# Patient Record
Sex: Female | Born: 1935 | Race: White | Hispanic: No | Smoking: Former smoker
Health system: Southern US, Community
[De-identification: ages and names within clinical notes are randomized; demographics above are authoritative.]

## PROBLEM LIST (undated history)

## (undated) DIAGNOSIS — E785 Hyperlipidemia, unspecified: Secondary | ICD-10-CM

## (undated) DIAGNOSIS — E119 Type 2 diabetes mellitus without complications: Secondary | ICD-10-CM

## (undated) DIAGNOSIS — I495 Sick sinus syndrome: Secondary | ICD-10-CM

## (undated) DIAGNOSIS — Z951 Presence of aortocoronary bypass graft: Secondary | ICD-10-CM

## (undated) DIAGNOSIS — I2510 Atherosclerotic heart disease of native coronary artery without angina pectoris: Secondary | ICD-10-CM

## (undated) DIAGNOSIS — H409 Unspecified glaucoma: Secondary | ICD-10-CM

## (undated) DIAGNOSIS — R079 Chest pain, unspecified: Secondary | ICD-10-CM

## (undated) DIAGNOSIS — I6529 Occlusion and stenosis of unspecified carotid artery: Secondary | ICD-10-CM

## (undated) DIAGNOSIS — I719 Aortic aneurysm of unspecified site, without rupture: Secondary | ICD-10-CM

## (undated) DIAGNOSIS — Z950 Presence of cardiac pacemaker: Secondary | ICD-10-CM

## (undated) DIAGNOSIS — K5730 Diverticulosis of large intestine without perforation or abscess without bleeding: Secondary | ICD-10-CM

## (undated) DIAGNOSIS — I4891 Unspecified atrial fibrillation: Secondary | ICD-10-CM

## (undated) DIAGNOSIS — Z9889 Other specified postprocedural states: Secondary | ICD-10-CM

## (undated) HISTORY — DX: Chest pain, unspecified: R07.9

## (undated) HISTORY — DX: Presence of aortocoronary bypass graft: Z95.1

## (undated) HISTORY — DX: Other specified postprocedural states: Z98.890

## (undated) HISTORY — PX: APPENDECTOMY: SHX54

## (undated) HISTORY — DX: Unspecified atrial fibrillation: I48.91

## (undated) HISTORY — DX: Sick sinus syndrome: I49.5

## (undated) HISTORY — DX: Type 2 diabetes mellitus without complications: E11.9

## (undated) HISTORY — PX: BACK SURGERY: SHX140

## (undated) HISTORY — DX: Hyperlipidemia, unspecified: E78.5

## (undated) HISTORY — DX: Presence of cardiac pacemaker: Z95.0

## (undated) HISTORY — DX: Diverticulosis of large intestine without perforation or abscess without bleeding: K57.30

## (undated) HISTORY — DX: Occlusion and stenosis of unspecified carotid artery: I65.29

## (undated) HISTORY — PX: PACEMAKER INSERTION: SHX728

## (undated) HISTORY — DX: Unspecified glaucoma: H40.9

## (undated) HISTORY — PX: TONSILLECTOMY AND ADENOIDECTOMY: SUR1326

## (undated) HISTORY — PX: CORONARY ARTERY BYPASS GRAFT: SHX141

## (undated) HISTORY — DX: Aortic aneurysm of unspecified site, without rupture: I71.9

## (undated) HISTORY — DX: Atherosclerotic heart disease of native coronary artery without angina pectoris: I25.10

---

## 2000-02-12 ENCOUNTER — Ambulatory Visit (HOSPITAL_COMMUNITY): Admission: RE | Admit: 2000-02-12 | Discharge: 2000-02-12 | Payer: Self-pay | Admitting: Ophthalmology

## 2000-03-16 ENCOUNTER — Encounter: Payer: Self-pay | Admitting: Internal Medicine

## 2000-03-16 ENCOUNTER — Ambulatory Visit (HOSPITAL_COMMUNITY): Admission: RE | Admit: 2000-03-16 | Discharge: 2000-03-16 | Payer: Self-pay | Admitting: Internal Medicine

## 2004-05-03 ENCOUNTER — Emergency Department (HOSPITAL_COMMUNITY): Admission: EM | Admit: 2004-05-03 | Discharge: 2004-05-03 | Payer: Self-pay | Admitting: Emergency Medicine

## 2005-06-16 DIAGNOSIS — Z950 Presence of cardiac pacemaker: Secondary | ICD-10-CM

## 2005-06-16 DIAGNOSIS — Z9889 Other specified postprocedural states: Secondary | ICD-10-CM

## 2005-06-16 DIAGNOSIS — Z951 Presence of aortocoronary bypass graft: Secondary | ICD-10-CM

## 2005-06-16 HISTORY — DX: Presence of cardiac pacemaker: Z95.0

## 2005-06-16 HISTORY — DX: Presence of aortocoronary bypass graft: Z95.1

## 2005-06-16 HISTORY — DX: Other specified postprocedural states: Z98.890

## 2005-10-19 ENCOUNTER — Inpatient Hospital Stay (HOSPITAL_COMMUNITY): Admission: EM | Admit: 2005-10-19 | Discharge: 2005-10-20 | Payer: Self-pay | Admitting: Emergency Medicine

## 2006-02-03 ENCOUNTER — Ambulatory Visit (HOSPITAL_COMMUNITY): Admission: RE | Admit: 2006-02-03 | Discharge: 2006-02-03 | Payer: Self-pay | Admitting: Internal Medicine

## 2006-02-03 ENCOUNTER — Ambulatory Visit: Payer: Self-pay | Admitting: Internal Medicine

## 2006-02-04 ENCOUNTER — Ambulatory Visit: Payer: Self-pay | Admitting: Internal Medicine

## 2006-02-10 ENCOUNTER — Ambulatory Visit (HOSPITAL_COMMUNITY): Admission: RE | Admit: 2006-02-10 | Discharge: 2006-02-10 | Payer: Self-pay | Admitting: Neurosurgery

## 2006-04-27 ENCOUNTER — Ambulatory Visit (HOSPITAL_COMMUNITY): Admission: RE | Admit: 2006-04-27 | Discharge: 2006-04-27 | Payer: Self-pay | Admitting: Cardiology

## 2006-04-27 ENCOUNTER — Encounter: Payer: Self-pay | Admitting: Vascular Surgery

## 2006-05-18 ENCOUNTER — Inpatient Hospital Stay (HOSPITAL_COMMUNITY): Admission: RE | Admit: 2006-05-18 | Discharge: 2006-05-27 | Payer: Self-pay | Admitting: Surgery

## 2006-06-25 ENCOUNTER — Encounter (HOSPITAL_COMMUNITY): Admission: RE | Admit: 2006-06-25 | Discharge: 2006-06-26 | Payer: Self-pay | Admitting: Cardiology

## 2006-06-25 ENCOUNTER — Encounter (HOSPITAL_COMMUNITY): Admission: RE | Admit: 2006-06-25 | Discharge: 2006-09-23 | Payer: Self-pay | Admitting: Cardiology

## 2006-12-15 ENCOUNTER — Encounter: Admission: RE | Admit: 2006-12-15 | Discharge: 2006-12-15 | Payer: Self-pay | Admitting: Internal Medicine

## 2007-08-23 IMAGING — CR DG CHEST 2V
2 series · 2 of 2 positions shown · non-contrast
Comparison: None.

CLINICAL DATA: 86 year old; shortness of breath and chest pain.  Preoperative respiratory exam for bypass surgery.
 CHEST - 2 VIEW - 05/14/06:

[view not recorded (1 of 2)]
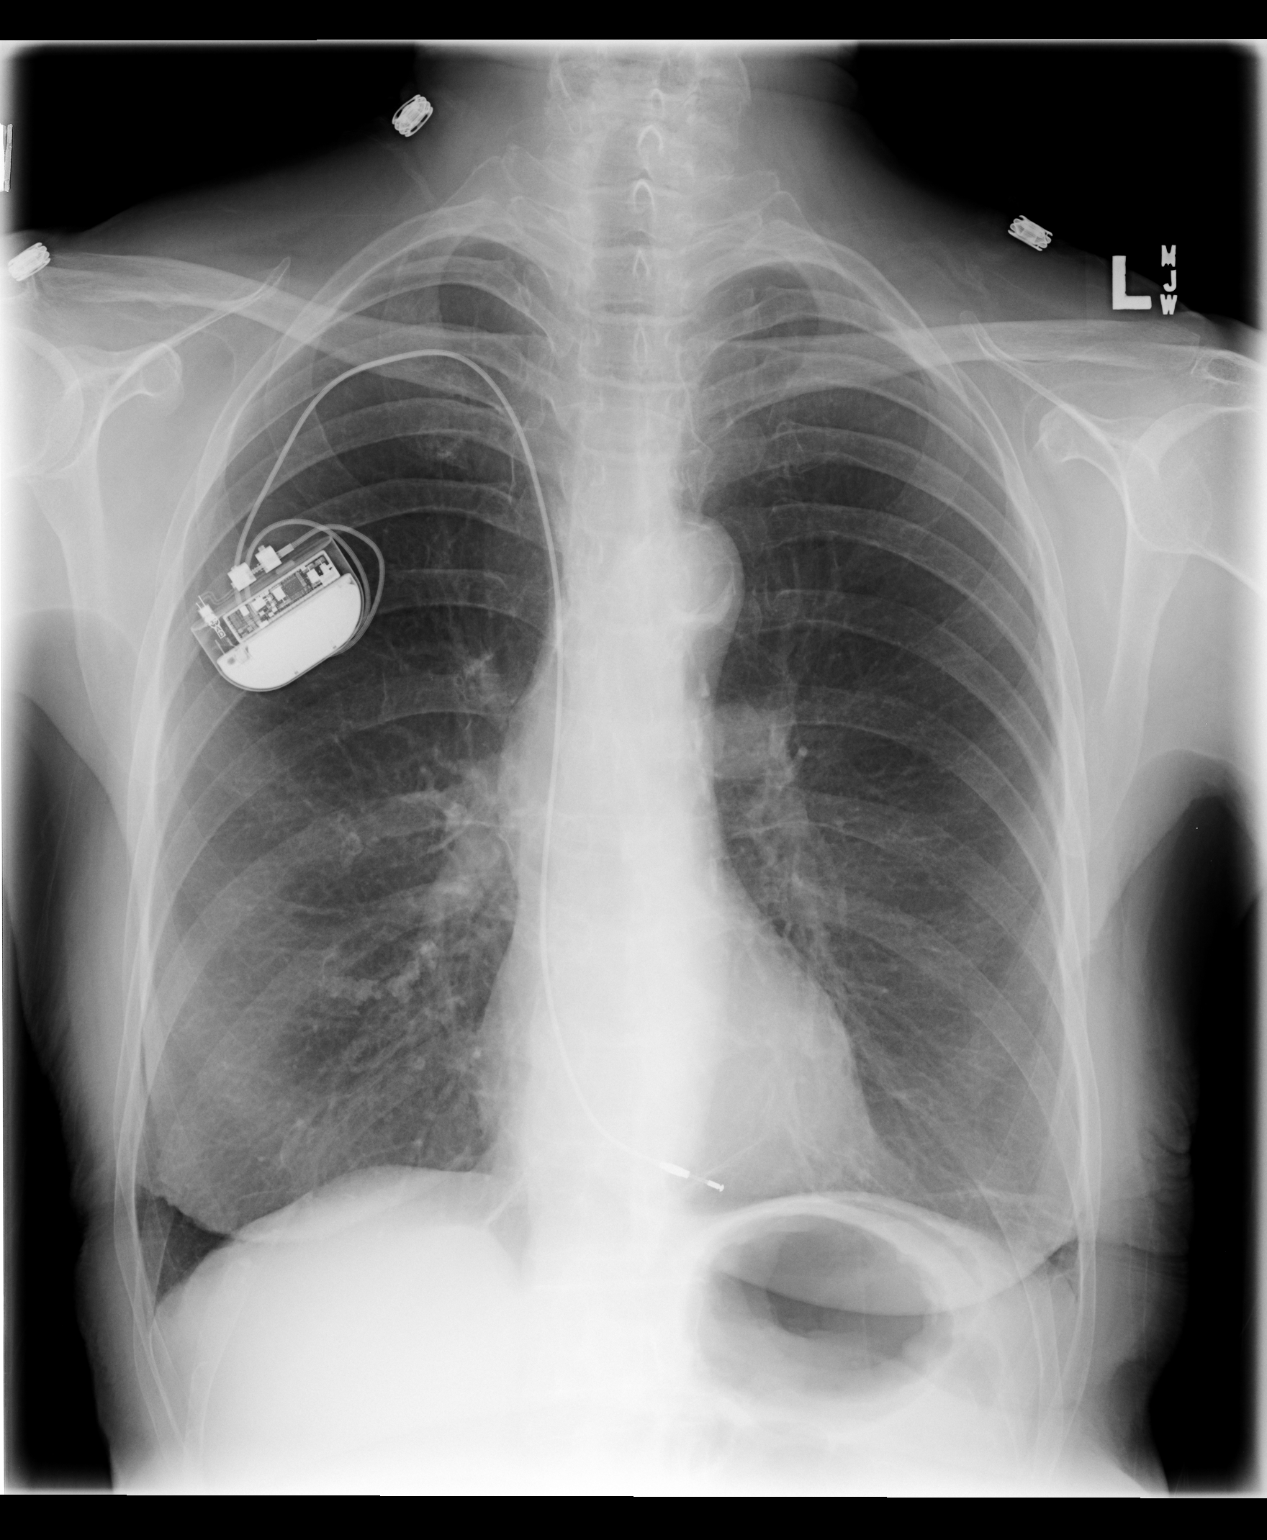

[view not recorded (2 of 2)]
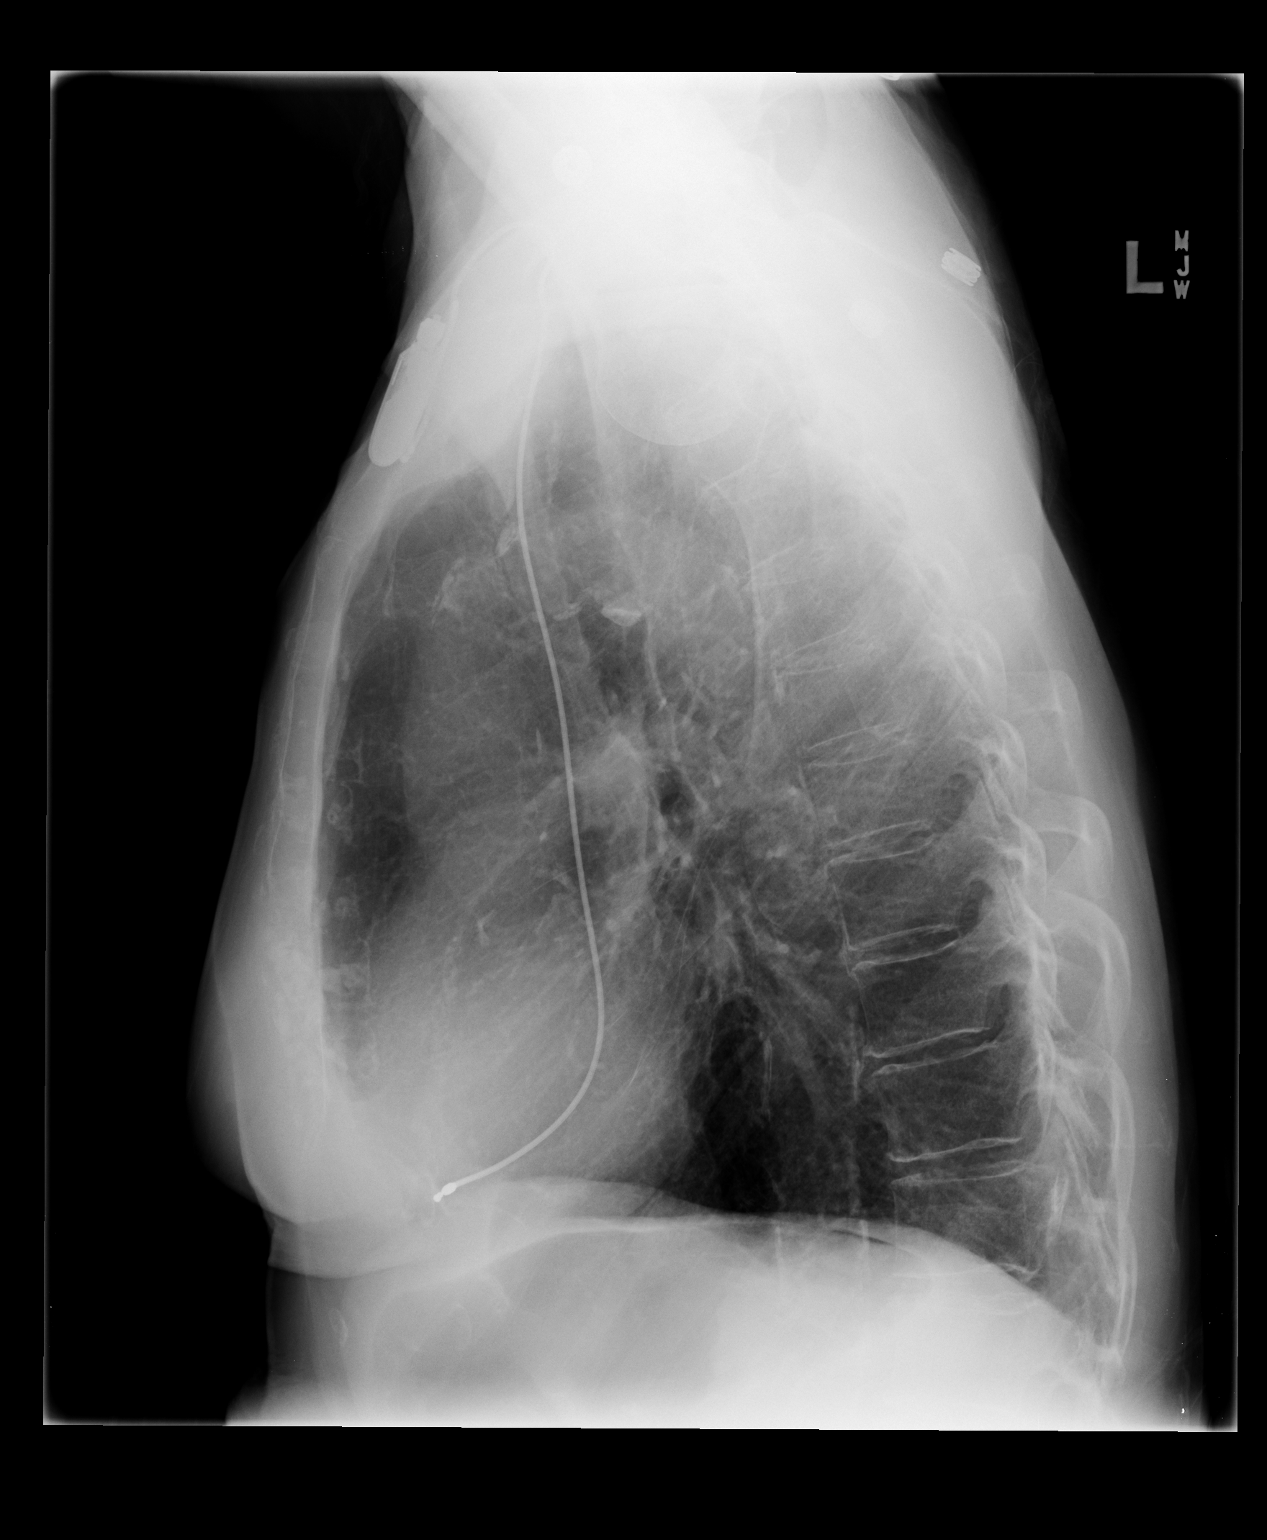

[2 of 2 positions shown; findings below may reference images not displayed]

FINDINGS: The cardiac silhouette, mediastinal and hilar contours are within normal limits.   There is a single pacer wire with its tip in the right ventricle.  The lungs demonstrate mild hyperinflation but no acute pulmonary findings.  There are streaky basilar scarring changes.  Vascular calcifications are noted.  The bony structures are intact.
IMPRESSION: No acute cardiopulmonary findings.

## 2007-08-30 IMAGING — CR DG CHEST 1V PORT
1 series · 1 of 1 positions shown · non-contrast
Comparison: 05/20/06.

CLINICAL DATA: CABG. 
 PORTABLE CHEST ? 1 VIEW ? 05/21/06 AT 6989 HOURS:

[view not recorded]
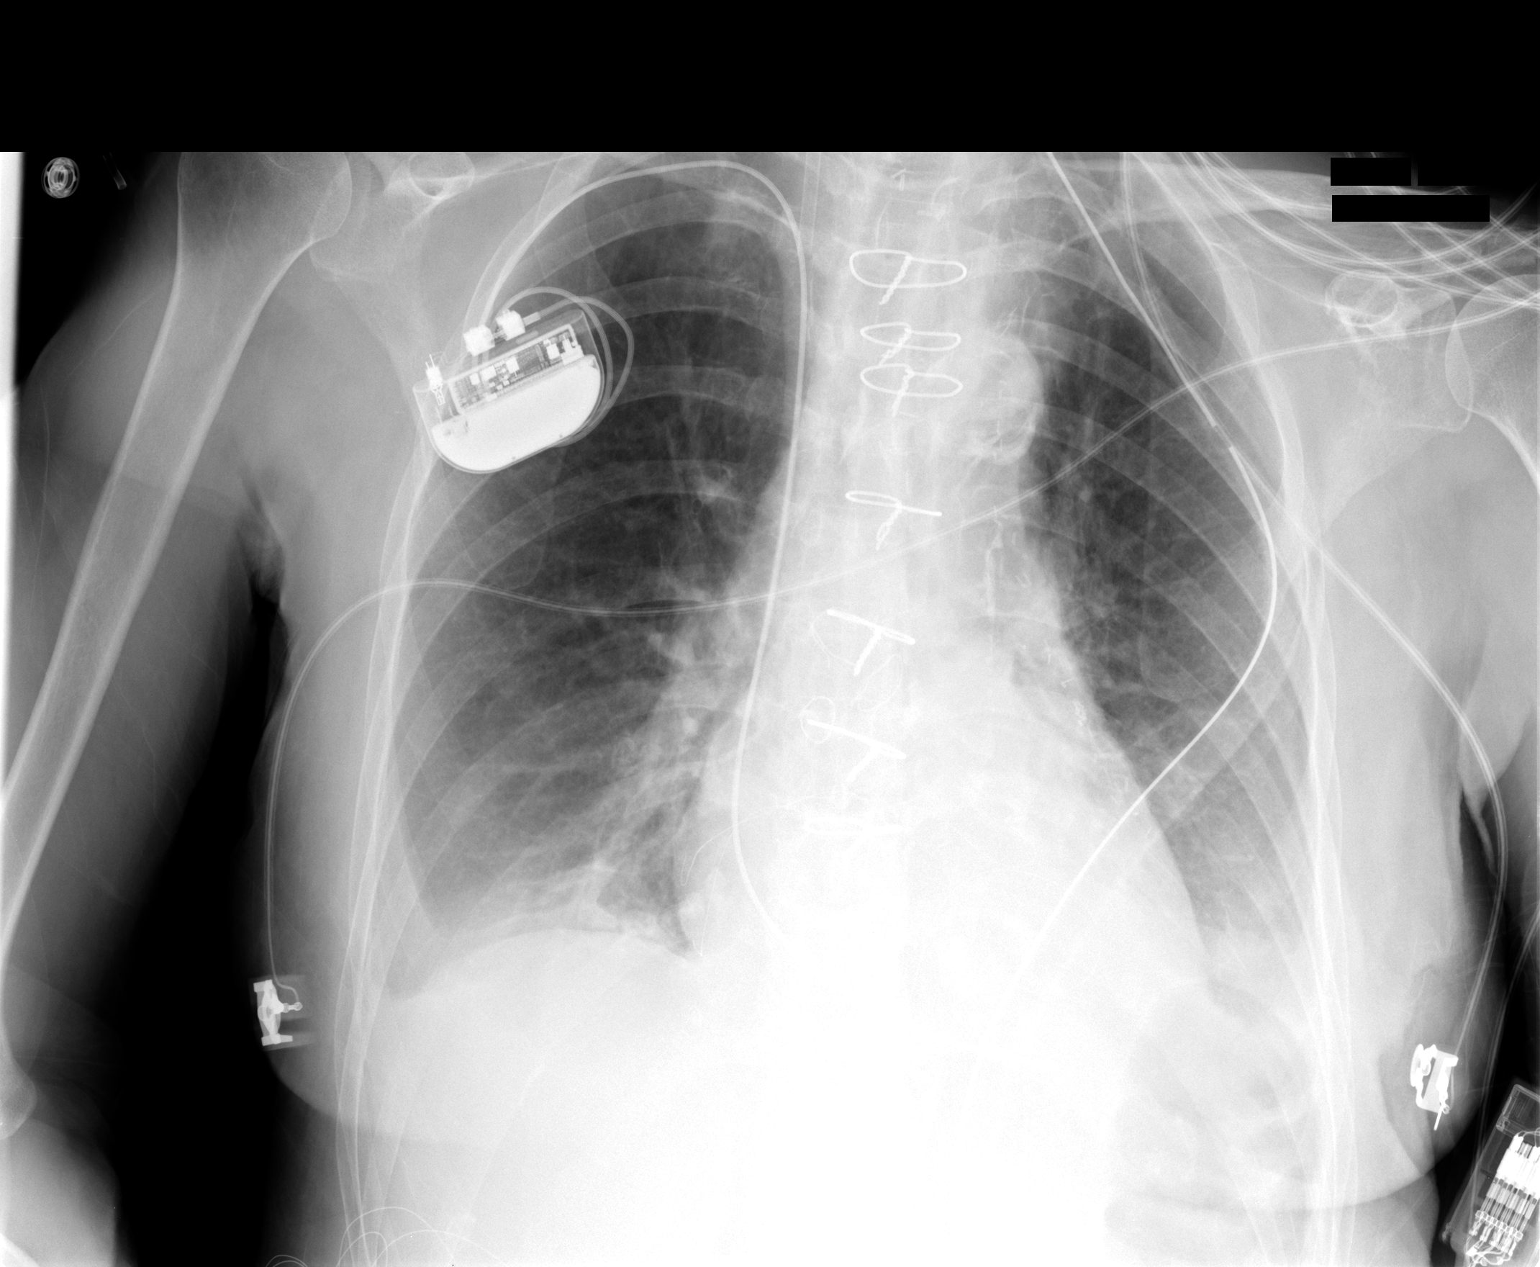

[1 of 1 positions shown; findings below may reference images not displayed]

FINDINGS: No definite pneumothorax is seen.  Left chest tube remains.  Basilar opacities remain, consistent with atelectasis and effusion.
IMPRESSION: No definite pneumothorax.  Little change in basilar atelectasis and effusions.

## 2007-08-31 IMAGING — CR DG CHEST 2V
2 series · 2 of 2 positions shown · non-contrast
Comparison: 05/21/06.

CLINICAL DATA: Coronary artery disease.  CABG.
 CHEST - 2 VIEW:

[w chest pa]
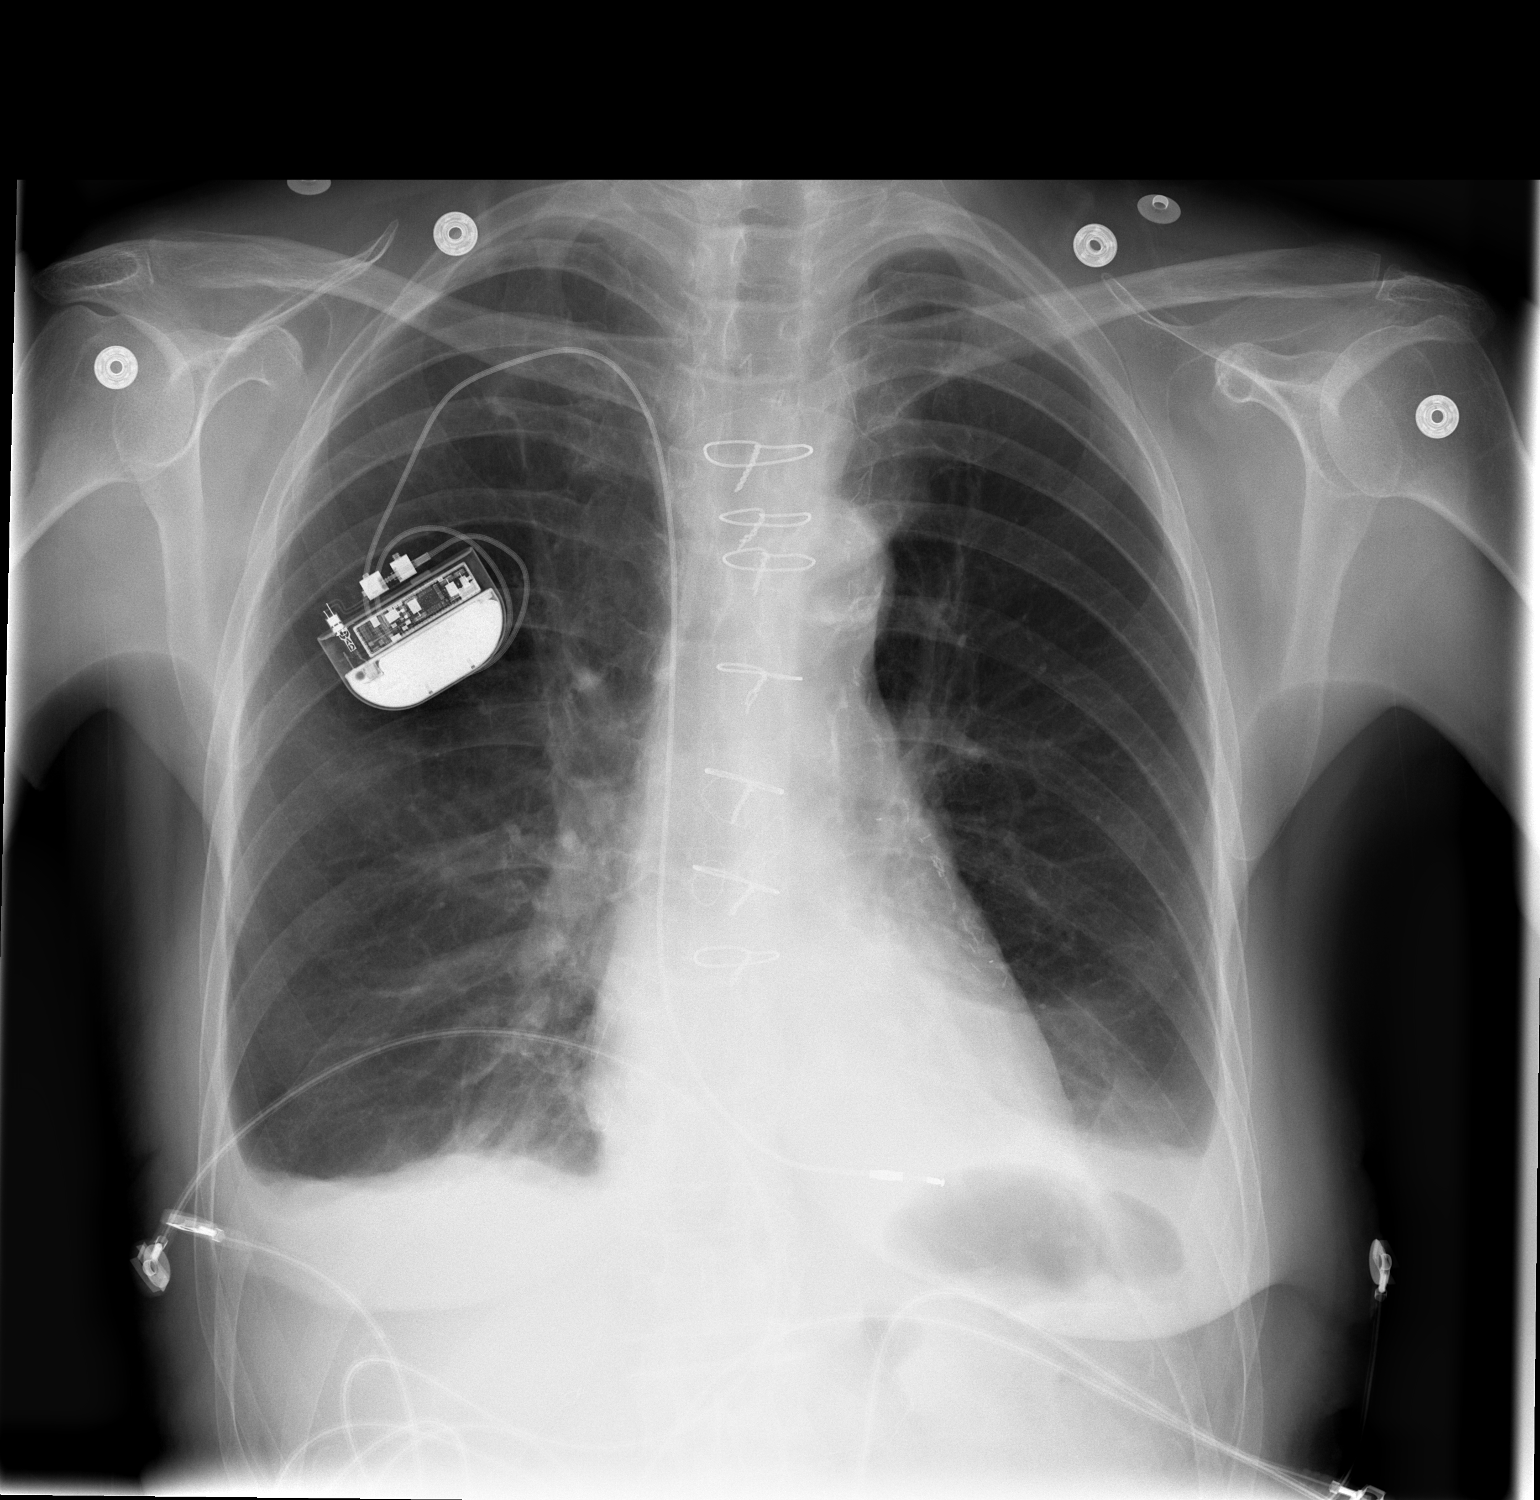

[w chest lat]
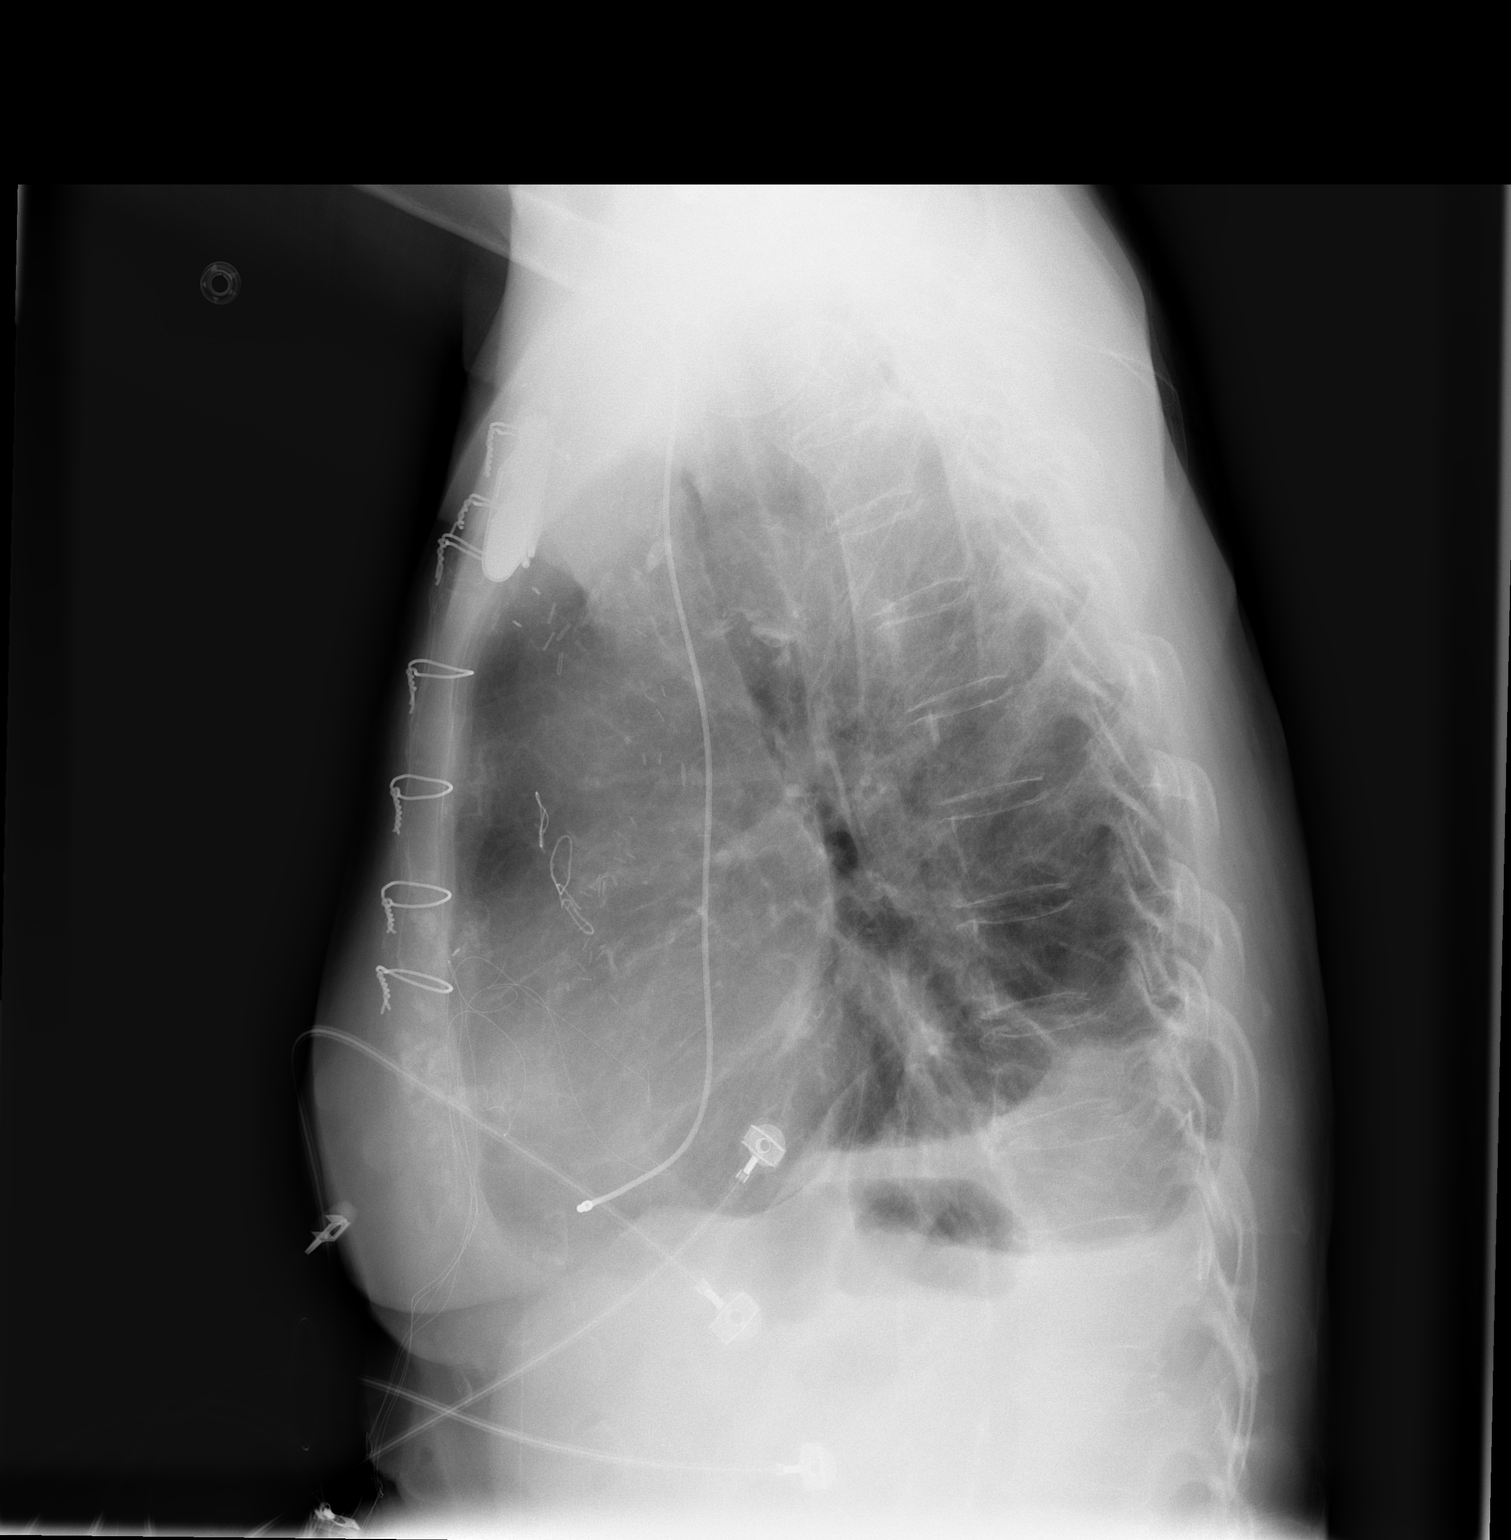

[2 of 2 positions shown; findings below may reference images not displayed]

FINDINGS: Stable cardiomediastinal silhouette.  Status post removal of left pleural chest tube and SGC sheath.  No pneumothorax.  Slight improvement in aeration, left lower lobe.   Partial left lower lobar atelectasis.  Small pleural effusions.  Improved aeration at the bases.  Resolved mild vascular congestion.
IMPRESSION: No pneumothorax.  Improved aeration of the lungs.  Mild atelectatic changes, left lower lobe. Small pleural effusions.

## 2008-11-05 ENCOUNTER — Emergency Department (HOSPITAL_COMMUNITY): Admission: EM | Admit: 2008-11-05 | Discharge: 2008-11-05 | Payer: Self-pay | Admitting: Emergency Medicine

## 2009-06-22 ENCOUNTER — Ambulatory Visit: Payer: Self-pay | Admitting: Vascular Surgery

## 2009-09-25 ENCOUNTER — Inpatient Hospital Stay (HOSPITAL_COMMUNITY): Admission: EM | Admit: 2009-09-25 | Discharge: 2009-09-26 | Payer: Self-pay | Admitting: Emergency Medicine

## 2009-09-25 DIAGNOSIS — R079 Chest pain, unspecified: Secondary | ICD-10-CM

## 2009-09-25 HISTORY — DX: Chest pain, unspecified: R07.9

## 2010-07-07 ENCOUNTER — Encounter: Payer: Self-pay | Admitting: Internal Medicine

## 2010-09-04 LAB — POCT I-STAT, CHEM 8
BUN: 15 mg/dL (ref 6–23)
Calcium, Ion: 1.07 mmol/L — ABNORMAL LOW (ref 1.12–1.32)
Chloride: 104 mEq/L (ref 96–112)
Creatinine, Ser: 0.9 mg/dL (ref 0.4–1.2)
HCT: 39 % (ref 36.0–46.0)
Sodium: 136 mEq/L (ref 135–145)

## 2010-09-04 LAB — POCT CARDIAC MARKERS
Myoglobin, poc: 91.9 ng/mL (ref 12–200)
Troponin i, poc: <0.05 ng/mL (ref 0.00–0.09)

## 2010-09-04 LAB — BASIC METABOLIC PANEL
BUN: 14 mg/dL (ref 6–23)
CO2: 21 mEq/L (ref 19–32)
Chloride: 105 mEq/L (ref 96–112)
GFR calc Af Amer: >60 mL/min (ref >60)
Glucose, Bld: 123 mg/dL — ABNORMAL HIGH (ref 70–99)
Sodium: 135 mEq/L (ref 135–145)

## 2010-09-04 LAB — CBC
HCT: 40.1 % (ref 36.0–46.0)
Hemoglobin: 13.6 g/dL (ref 12.0–15.0)
Platelets: 183 10*3/uL (ref 150–400)
RBC: 4.42 MIL/uL (ref 3.87–5.11)
RDW: 13.4 % (ref 11.5–15.5)
WBC: 5.4 10*3/uL (ref 4.0–10.5)

## 2010-09-04 LAB — CK TOTAL AND CKMB (NOT AT ARMC)
Relative Index: INVALID (ref 0.0–2.5)
Total CK: 71 U/L (ref 7–177)

## 2010-09-04 LAB — PROTIME-INR: INR: 0.87 (ref 0.00–1.49)

## 2010-09-04 LAB — LIPID PANEL: VLDL: 23 mg/dL (ref 0–40)

## 2010-09-04 LAB — CARDIAC PANEL(CRET KIN+CKTOT+MB+TROPI): Relative Index: INVALID (ref 0.0–2.5)

## 2010-09-24 LAB — URINALYSIS, ROUTINE W REFLEX MICROSCOPIC
Glucose, UA: NEGATIVE mg/dL
Ketones, ur: NEGATIVE mg/dL
Nitrite: NEGATIVE
Specific Gravity, Urine: 1.007 (ref 1.005–1.030)
pH: 7 (ref 5.0–8.0)

## 2010-09-24 LAB — URINE MICROSCOPIC-ADD ON

## 2010-10-29 NOTE — Procedures (Signed)
DUPLEX ULTRASOUND OF ABDOMINAL AORTA   INDICATION:  Followup abdominal aortic aneurysm.   HISTORY:  Diabetes:  No.  Cardiac:  No.  Hypertension:  Yes.  Smoking:  No.  Connective Tissue Disorder:  Family History:  No.  Previous Surgery:  No.   DUPLEX EXAM:         AP (cm)                   TRANSVERSE (cm)  Proximal             1.63 cm                   1.54 cm  Mid                  2.63 cm                   3.66 cm  Distal               1.96 cm                   1.96 cm  Right Iliac          1.06 cm                   0.86 cm  Left Iliac           0.74 cm                   0.94 cm   PREVIOUS:  Date:  AP:  TRANSVERSE:   IMPRESSION:  There appears to be an abdominal aortic aneurysm noted with  largest measurement of 2.63 cm x 3.66 cm.   ___________________________________________  Larina Earthly, M.D.   CB/MEDQ  D:  06/22/2009  T:  06/22/2009  Job:  [id number]

## 2010-10-29 NOTE — Consult Note (Signed)
NEW PATIENT CONSULTATION   Lisa Marquez, Lisa Marquez  DOB:  April 16, 1920                                       06/22/2009  ZOXWR#:[id number]   Lisa Marquez presents today for evaluation of abdominal aortic aneurysm.  She also has known carotid stenosis.  We have discussed this with her as  well.  She is a very pleasant, alert, white female who does have a  history of coronary artery bypass grafting approximately 3 years ago.  She has had a known history of a small aneurysm and has been followed  since that time.  She also has a history of known asymptomatic left  carotid stenosis which is being followed at [facility] with serial ultrasounds.  She specifically denies any  amaurosis fugax, transient ischemic attack or stroke, and has no  symptoms referable to her aneurysm, and no history of performed emboli.   PAST HISTORY:  Significant for coronary disease as mentioned with  coronary bypass grafting, hypothyroidism.  She is not diabetic.   SOCIAL HISTORY:  She is retired, widowed.   FAMILY HISTORY:  Mother died of respiratory illness at age 32.  Father  of stroke at age 75.  Sister had lung cancer.  She has 2 sons who are  alive and well.   REVIEW OF SYSTEMS:  Negative except for constipation and leg pain with  walking.   PHYSICAL EXAM:  Well-developed, well-nourished white female appearing  stated age, 51.  She is in no acute distress.  Blood pressure 152/79, pulse 92, respirations 14.  HEENT:  Atraumatic, normocephalic.  I could not appreciate a bruit on  her neck.  She has no lymphadenopathy in her neck or other masses.  CHEST:  Clear bilaterally.  HEART:  Regular rate and rhythm.  ABDOMEN:  Soft.  I do not palpate an aneurysm.  She has no tenderness  and no other masses.  MUSCULOSKELETAL:  No major deformities or cyanosis.  NEUROLOGICAL:  Intact with no weakness.  SKIN:  Without rashes.   On ultrasound today, maximal diameter is 3.5 cm.  Her  duplex by Dr.  Carolee Rota office from April 05, 2009, shows moderate to severe left  carotid stenosis . I discussed this at length with Ms. Denes and son  present.  I would not recommend any further ultrasound followup due to  her advanced age and very small aneurysm.  I feel that it would be quite  unlikely that she would have ever progress to a size that would warrant  repair at her advanced age, 77.  I would recommend continued yearly carotid  duplex ultrasound, as is being done in Dr. Carolee Rota office and would  recommend further evaluation for either focal symptoms or progressive  stenosis.  She will see [place] again on an as-needed basis.  Larina Earthly, M.D.  Electronically Signed   TFE/MEDQ  D:  06/22/2009  T:  06/25/2009  Job:  [id number]   cc:   Soyla Murphy. Renne Crigler, M.D.

## 2010-11-01 NOTE — Assessment & Plan Note (Signed)
[facility]                           GASTROENTEROLOGY OFFICE NOTE   NAME:Mccombs, Samyiah                         MRN:          [id number]  DATE:02/03/2006                            DOB:          1919/11/08    Ms. Greening is a delightful, 77 year old white female, here today as an  acute working for abdominal distention, abdominal discomfort, change in the  bowel movements to ropy, narrow caliber stools.  We have seen Ms. Oh  for constipation several years ago.  Unfortunately, we cannot locate her  records.  She had a colonoscopy about six years ago, but I was unable to  retrieve it from the [place] system.  She has been on MiraLax 17 g daily with  good results for the past several years.  She denies rectal bleeding.  She  denies any nausea or vomiting, but has had early satiety.  She denies  heartburn, dysphagia.  She has to strain to have a bowel movement.   MEDICATIONS:  1. Propranolol 10 mg twice daily.  2. Micardis 40 mg twice daily.  3. MiraLax 17 g daily.  4. Benefiber.  5. Aspirin.   PAST HISTORY:  High blood pressure, heart rhythm problems, appendectomy, and  back surgery.   FAMILY HISTORY:  Mother had respiratory problems.  Father had a stroke.  Lung cancer in sister.  Heart disease in brother.   SOCIAL HISTORY:  She is widowed, has two children.  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  She does not smoke and does not  drink.   REVIEW OF SYSTEMS:  Weight has been stable.  She has fainting spells, back  pain, chronic constipation.   PHYSICAL EXAMINATION:  VITAL SIGNS:  Blood pressure 136/82, pulse 52, weight  123 pounds.  GENERAL:  She was in no distress, but somewhat agitated, talking about her  ropy bowel movements, over and over.  HEENT:  Oral cavity was normal.  Sclerae is anicteric.  NECK:  Supple, without adenopathy.  LUNGS:  Clear to auscultation.  COR:  With rapid S1, S2, no murmur.  ABDOMEN:   Soft, mildly protuberant with increased tympany throughout the  entire colon, but soft, able to manipulate the air through the colon.  There  was no mass, no bruit.  RECTAL EXAM:  With normal rectal tone.  There was scant yellow stool, which  was heme-negative.  EXTREMITIES:  No edema.   IMPRESSION:  1. The patient is an 77 year old white female with change in the bowel      habits, chronic constipation, now with new-onset abdominal distention.      It is possibly partial colon obstruction, rule out symptomatic      diverticulosis, irritable bowel syndrome, so possibly just a rectocele,      causing incomplete evacuation.  2. Early satiety.  This again could be related to constipation and      abdominal distention.  There is no evidence of occult GI blood loss and      she really does not have any specific upper GI symptoms of  reflux.      Rule out pancreatic disease.  3. Status post previous colonoscopy, six or seven years ago.  We need to      obtain the report of it.  4. Rule out bacterial overgrowth, causing abdominal distention, dyspepsia.   PLAN:  1. Discontinue MiraLax.  Start Amitiza 25 micrograms daily.  2. Schedule upper abdominal ultrasound to specifically look at the      ultrasound of her gallbladder.  3. Today, we will obtain KUB to assess the abdominal distention, rule out      distended colon versus small bowel.  4. Obtain colonoscopy report from Dr. Smitty Cords.  5. CBC, amylase, lipase and CMET today.                                   Hedwig Morton. Juanda Chance, MD   DMB/MedQ  DD:  02/03/2006  DT:  02/04/2006  Job #:  [id number]   cc:   Othelia Pulling, MD

## 2010-11-01 NOTE — Procedures (Signed)
[facility]                            ABDOMINAL ULTRASOUND REPORT   NAME:Lisa Marquez, Lisa Marquez                         MRN:          [id number]  DATE:02/09/2006                            DOB:          [birth year]    ACCESSION NUMBER:  [id number]   INDICATIONS:  Abdominal pain.   READING PHYSICIAN:  Hedwig Morton. Juanda Chance, MD   PROCEDURE:  Multiplanar abdominal ultrasound imaging was performed in the  upright, supine, right and left lateral decubitus positions.   RESULTS:  Abdominal aorta reveals fusiform aneurysm, 3.4 cm in largest  diameter in mid portion of the aorta.  Inferior vena cava is patent.   The pancreas appears normal throughout the head, body and tail without  evidence of ductal dilatation, pancreatic masses, or peripancreatic  inflammation.   Gallbladder with multiple mobile stone measuring 5-8 mm in diameter.  Gallbladder wall is normal at 2.2 mm.  No pericholecystic fluid.   The common bile duct measures 6.9 mm, within normal limits.   The liver appears normal without evidence of parenchymal lesion, ductal  dilatation or vascular abnormality.   Kidneys, right 9.1 cm, left 9.7 cm.   Spleen is 9 cm.   IMPRESSION:  1. Cholelithiasis, multiple mobile stones with normal appearing      gallbladder wall and common bile duct.  2. Small fusiform abdominal aortic aneurysm at 3.4 cm at largest diameter.                                   Hedwig Morton. Juanda Chance, MD   DMB/MedQ  DD:  02/09/2006  DT:  02/09/2006  Job #:  [id number]   cc:   Othelia Pulling, MD

## 2010-11-01 NOTE — H&P (Signed)
NAME:  Lisa Marquez, Lisa Marquez NO.:  [id number]   MEDICAL RECORD NO.:  [id number]          PATIENT TYPE:  INP   LOCATION:  1824                         FACILITY:  [facility]   PHYSICIAN:  Lisa Marquez, M.D.DATE OF BIRTH:  1920/03/04   DATE OF ADMISSION:  10/19/2005  DATE OF DISCHARGE:                                HISTORY & PHYSICAL   PRIMARY CARE PHYSICIAN:  Lisa Marquez, M.D.   CHIEF COMPLAINT:  Syncope.   HISTORY OF PRESENT ILLNESS:  Ms. Lisa Marquez is a pleasant 77 year old  female. She was in her usual state of health. She went to Sunday school this  morning. In Sunday school, she did report that she felt weak and tired. In  retrospect, she does state that she had probably been feeling that way since  waking up this morning. Nonetheless, she suddenly became unresponsive in her  Sunday school class, slumping over on the person sitting beside her. There  was no seizure type activity noted. The patient spontaneously awoke in the  class room at church. At that time, she had no chest pain and no shortness  of breath. She simply reports feeling very tired. She was transported to  the [facility] emergency room uneventfully and has had no other complaints  during her stay there. She does have a pacemaker, which she says was placed  approximately 12 years ago due to recurrent syncope. She reports that she  has had regular visits for pacemaker interrogation and has had telephone  checks of her pacemaker on a regular basis, with the last being  approximately 2 weeks ago.   REVIEW OF SYSTEMS:  The patient complains of chronic low back pain and  reports that she has had multiple surgeries in this area. She also reports  chronic constipation, for which she is completely dependent on MiraLax 17  grams a day. Comprehensive review of systems is otherwise unremarkable.   PAST MEDICAL HISTORY:  1.  Status post pacemaker placement approximately 12 years ago, presumably  secondary to bradycardia and resultant syncope.  2.  Hypertension.  3.  Diet controlled diabetes mellitus.  4.  Hyponatremia.  5.  Benign resting tremor.  6.  Chronic low back pain status post surgical intervention on multiple      episodes without relief with symptoms radiating down the right leg.  7.  Remote history of tobacco abuse.   MEDICATIONS:  1.  Maxzide 32.5/25 daily.  2.  Micardis 40 mg daily.  3.  Propranolol 5 mg b.i.d.  4.  MiraLax 17 grams daily.  5.  Aspirin 81 mg daily.   ALLERGIES:  CODEINE.   FAMILY HISTORY:  Noncontributory secondary to age.   SOCIAL HISTORY:  The patient does not currently smoke. She does not drink.  She lives alone. She has 2 sons who live close by who help care for her.   LABORATORY DATA:  White count is elevated at 13.4. Hemoglobin, platelet  count, and MCV are normal. Sodium is low at 129. Potassium, chloride, bicarb  are normal. BUN is elevated at 21. Creatinine is normal at 1.1.  Serum  glucose is elevated at 138. Point of care cardiac markers are negative x2.   CT scan of the head reveals no acute disease.   A 12 lead EKG is normal sinus rhythm at 73 beats per minute with bi-phasic T  waves diffusely.   PHYSICAL EXAMINATION:  VITAL SIGNS:  Temperature 97.8, Marquez pressure  132/56, heart rate 88, respiratory rate 23. O2 sat is 100% on 2 liter per  minute nasal cannula.  GENERAL:  A well developed, well nourished  female in no acute respiratory  distress.  HEENT:  Normocephalic and atraumatic. Pupils are equal, round, and reactive  to light and accommodation. Extraocular muscles intact bilaterally. OT/OP  clear.  NECK:  No JVD. No lymphadenopathy. No thyromegaly.  LUNGS:  Clear to auscultation bilaterally without wheezes or rhonchi.  CARDIOVASCULAR:  Regular rate and rhythm. Without gallop or rub appreciable  with normal S1 and S2.  ABDOMEN:  Nontender, nondistended, soft. Bowel sounds present. No  hepatosplenomegaly. No  ascites.  EXTREMITIES:  No significant clubbing, cyanosis, or edema bilateral lower  extremities.  NEUROLOGIC:  5 over 5 strength bilateral upper and lower extremities. Intact  sensation and touch throughout. No Babinski. Alert and oriented times four.  Cranial nerves 2-12 are intact bilaterally.   IMPRESSION/PLAN:  1.  SYNCOPE:  It is quite possible that the patient's syncope was simply      related to dehydration, hyponatremia. Other potential etiologies would      include pacemaker malfunction. The patient's pacemaker is apparently 75      years old. During the patient's hospital stay, we will ask Dr. Aleen Marquez      to arrange for interrogation of the pacemaker to assure that the battery      life has not been exceeded. With ongoing beta blocker use, if her      pacemaker was to fail, she certainly would be at high risk for      bradycardia and resultant syncope. I will also ruled out urinary tract      infection as the patient does have an elevated white count. We will      observe the patient on telemetry during her hospital stay.  2.  HYPONATREMIA:  The patient appears to have a hypovolemic, hyponatremia.      This is likely secondary to Maxzide therapy. It would be nice to be able      to discontinue her Maxzide altogether, as the patient likely will      continue to have a propensity for dehydration, which is likely worsening      her chronic constipation. We will hold Maxzide during her hospital stay      and see if her Marquez pressure is well controlled without it. If the      Marquez pressure does edge up, we can consider increasing her Micardis.  3.  DEHYDRATION:  The patient does exhibit a significant degree of      dehydration. She is not orthostatic at the present time but she has been      given IV fluid in the emergency room. It is quite possible that the      patient's syncope was simply an orthostatic issue, although the patient     was sitting at the time. She will be  hydrated gently. We will avoid      diuretics from this point forward.  4.  LEUKOCYTOSIS:  It is quite possible that the patient's leukocytosis is  simply secondary to a stress de-margination. In the setting of an      unexplained syncope, however, I would be concerned about the possibility      of an occult infection. Urinalysis and chest x-ray have not been      obtained but have now been ordered.      Lisa Marquez, M.D.  Electronically Signed     JTM/MEDQ  D:  10/19/2005  T:  10/19/2005  Job:  [id number]   cc:   Antony Madura, M.D.  Fax: [phone]   Antionette Char, MD  Fax: [phone]

## 2010-11-01 NOTE — Discharge Summary (Signed)
NAME:  Lisa Marquez, Lisa Marquez                ACCOUNT NO.:  [id number]   MEDICAL RECORD NO.:  [id number]          PATIENT TYPE:  INP   LOCATION:  3731                         FACILITY:  [facility]   PHYSICIAN:  Isidor Holts, M.D.  DATE OF BIRTH:  05-01-1920   DATE OF ADMISSION:  10/19/2005  DATE OF DISCHARGE:                                 DISCHARGE SUMMARY   DISCHARGE DIAGNOSES:  1.  Syncopal episode secondary to dehydration/orthostasis.  2.  Dehydration/hyponatremia, secondary to diuretics.  3.  History of bradyarrhythmia, status post permanent pacer 12 years ago.  4.  History of hypertension.  5.  History of type 2 diabetes mellitus.  6.  Benign essential tremor.  7.  History of chronic low back pain.  8.  History of chronic constipation.   DISCHARGE MEDICATIONS:  1.  Micardis 40 mg p.o. daily.  2.  Propranolol 5 mg p.o. b.i.d.  3.  MiraLax 17 g daily.  4.  Aspirin 81 mg daily.  5.  Maxzide - This has been discontinued.   PROCEDURES:  1.  Chest x-ray dated Oct 19, 2005.  This showed mild left basilar      atelectasis/scarring.  2.  Head CT scan dated Oct 19, 2005.  This showed no acute intracranial      abnormalities.   CONSULTATIONS:  Dr. Aleen Campi, cardiologist.   ADMISSION HISTORY:  As in H&P notes of Oct 19, 2005, dictated by Dr. Jetty Duhamel.  However, in brief, this is an 77 year old female, with known  history of bradyarrhythmia, status post permanent pacer approximately 12  years ago, hypertension, diet-controlled diabetes mellitus, benign essential  tremor, chronic low back pain, and chronic constipation, who presents  following a syncopal episode, without any antecedent illness.  In the day of  episode, she had been feeling generalized weakness and tiredness.  She was  admitted for further evaluation, investigation, and management.   CLINICAL COURSE:  #1 - SYNCOPAL EPISODE.  The patient apparently suffered a  syncopal episode, preceded by weakness, lethargy.  Initial  laboratory  evaluation, revealed a hyponatremia of 129.  BUN was elevated at 21 with  creatinine of 1.1.  These clinical features were consistent with dehydration  and sodium depletion against a background of diuretic therapy.  The culprit  is felt to be Maxzide, which the patient is taking on a regular basis.  Maxzide was, of course, discontinued.  The patient was managed with  intravenous rehydration with normal saline, resulting in resolution of  dehydration.  As of Oct 20, 2005, a.m. BUN was 13 with creatinine of 1.0 and  sodium had practically normalized at 133.  Orthostatics checked in a.m. of  Oct 20, 2005, showed no postural drop, the patient was able to ambulate  without any symptoms whatsoever, appeared alert, oriented, communicative,  and feels much better.   #2 - HISTORY OF BRADYARRHYTHMIA.  The patient has a background history of  bradyarrhythmia, and is status post permanent pacer approximately 12 years  ago, for recurrent syncopal episodes related to this.  Apparently she has  done quite well since, and undergoes pacemaker  checks every 5 weeks by Dr.  Aleen Campi.  As a matter of fact, her last pacemaker check was approximately 2  weeks ago.  Dr. Aleen Campi was invited on consultation for pacemaker  evaluation. This has been completed, and per Dr. Aleen Campi, pacemaker is  functioning quite well, although battery life is expected to last only  another 18 months.  He has recommended that the patient continue to follow  up with him on an outpatient basis, per prior scheduled appointments.  Cardiac enzymes were cycled.  They were unelevated.  There were no acute  cardiac ischemic changes on EKG, which showed sinus rhythm at 73 beats per  minute.  No bradycardia was noted during the course of the patient's  hospital stay.  Chest x-ray was unremarkable, as was head CT scan.   #3 - HISTORY OF BENIGN ESSENTIAL TREMOR.  The patient continues on beta  blocker treatment for this.   #4 -  HISTORY OF CHRONIC CONSTIPATION.  There were no symptoms referable to  this, during the course of the patient's hospital stay.   #5 - BACK PAIN.  This was not troublesome, during the course of the  patient's hospital stay.   #6 - TYPE 2 DIABETES MELLITUS.  This was controlled with carbohydrate-  modified diet.   DISPOSITION:  The patient was considered sufficiently recovered and  clinically stable to be discharged on Oct 20, 2005.   DIET:  Carbohydrate modified/healthy heart.   ACTIVITY:  As tolerated.   WOUND CARE:  Not applicable.   PAIN MANAGEMENT:  Not applicable.   FOLLOWUP INSTRUCTIONS:  The patient is instructed to follow up with her PMD,  Dr. Burton Apley, within 1 week.  Her family is to call for an  appointment.  She is also to follow up routinely with Dr. Aleen Campi for pacer  checks, per prior scheduled appointment.      Isidor Holts, M.D.  Electronically Signed     CO/MEDQ  D:  10/20/2005  T:  10/20/2005  Job:  [id number]   cc:   Antony Madura, M.D.  Fax: [phone]   Antionette Char, MD  Fax: [phone]

## 2012-10-14 ENCOUNTER — Encounter: Payer: Self-pay | Admitting: *Deleted

## 2013-01-11 ENCOUNTER — Other Ambulatory Visit: Payer: Self-pay | Admitting: Cardiovascular Disease

## 2013-01-11 NOTE — Telephone Encounter (Signed)
Rx was sent to pharmacy electronically. 

## 2013-03-01 ENCOUNTER — Encounter: Payer: Self-pay | Admitting: Cardiovascular Disease

## 2013-03-07 ENCOUNTER — Ambulatory Visit (INDEPENDENT_AMBULATORY_CARE_PROVIDER_SITE_OTHER): Payer: Medicare Other | Admitting: *Deleted

## 2013-03-07 DIAGNOSIS — I4891 Unspecified atrial fibrillation: Secondary | ICD-10-CM

## 2013-03-07 DIAGNOSIS — I495 Sick sinus syndrome: Secondary | ICD-10-CM

## 2013-03-07 LAB — PACEMAKER DEVICE OBSERVATION
AL AMPLITUDE: 5 mv
AL IMPEDENCE PM: 405 Ohm
AL THRESHOLD: 0.5 V
ATRIAL PACING PM: 95
DEVICE MODEL PM: 1160564
RV LEAD IMPEDENCE PM: 745 Ohm

## 2013-03-07 NOTE — Progress Notes (Signed)
In office pacemaker interrogation. Normal device function. No changes made this session. 

## 2013-03-07 NOTE — Patient Instructions (Addendum)
Your physician recommends that you schedule a follow-up appointment in: 3 months for a pacemaker check 

## 2013-06-14 ENCOUNTER — Ambulatory Visit (INDEPENDENT_AMBULATORY_CARE_PROVIDER_SITE_OTHER): Payer: Medicare Other | Admitting: *Deleted

## 2013-06-14 DIAGNOSIS — I495 Sick sinus syndrome: Secondary | ICD-10-CM

## 2013-06-14 DIAGNOSIS — I4891 Unspecified atrial fibrillation: Secondary | ICD-10-CM

## 2013-06-14 LAB — MDC_IDC_ENUM_SESS_TYPE_INCLINIC
Battery Impedance: 1700 Ohm
Battery Voltage: 2.74 V
Brady Statistic RV Percent Paced: 5.3 %
Date Time Interrogation Session: 20141230145816
Implantable Pulse Generator Serial Number: 1160564
Lead Channel Impedance Value: 387 Ohm
Lead Channel Impedance Value: 685 Ohm
Lead Channel Pacing Threshold Amplitude: 0.5 V
Lead Channel Pacing Threshold Pulse Width: 0.5 ms
Lead Channel Setting Pacing Amplitude: 2.5 V
MDC IDC MSMT LEADCHNL RA SENSING INTR AMPL: 4 mV
MDC IDC MSMT LEADCHNL RV PACING THRESHOLD AMPLITUDE: 1 V
MDC IDC MSMT LEADCHNL RV PACING THRESHOLD PULSEWIDTH: 0.5 ms
MDC IDC MSMT LEADCHNL RV SENSING INTR AMPL: 6 mV
MDC IDC SET LEADCHNL RA PACING AMPLITUDE: 2.5 V
MDC IDC SET LEADCHNL RV PACING PULSEWIDTH: 0.5 ms
MDC IDC SET LEADCHNL RV SENSING SENSITIVITY: 2 mV
MDC IDC STAT BRADY RA PERCENT PACED: 92 %

## 2013-06-14 NOTE — Patient Instructions (Signed)
Your physician recommends that you schedule a follow-up appointment in: 3 months for a pacemaker check 

## 2013-06-22 NOTE — Progress Notes (Signed)
Pacemaker check in clinic. Normal device function. Thresholds, sensing, impedances consistent with previous measurements. Device programmed to maximize longevity. 293 mode switches(<1%)---max dur. 16 sec, Max A 233---last 12/30---no EGMs. No high ventricular rates noted. Device programmed at appropriate safety margins. Histogram distribution appropriate for patient activity level. Device programmed to optimize intrinsic conduction. Estimated longevity 3.5-5 years. Patient will follow up with [facility] in 3 months.

## 2013-06-27 ENCOUNTER — Telehealth: Payer: Self-pay | Admitting: Cardiovascular Disease

## 2013-06-27 NOTE — Telephone Encounter (Signed)
Wants to talk to Dr Sallyanne Kuster . Would not disclose what was about.  Please call

## 2013-06-28 ENCOUNTER — Other Ambulatory Visit (HOSPITAL_COMMUNITY): Payer: Self-pay | Admitting: Cardiovascular Disease

## 2013-06-28 DIAGNOSIS — I6529 Occlusion and stenosis of unspecified carotid artery: Secondary | ICD-10-CM

## 2013-07-01 ENCOUNTER — Inpatient Hospital Stay (HOSPITAL_COMMUNITY): Admission: RE | Admit: 2013-07-01 | Payer: Medicare Other | Source: Ambulatory Visit

## 2013-07-04 NOTE — Telephone Encounter (Signed)
Discussed w/Dr. C.  Did not feel the need to have a Carotid Doppler study done.  Appointment canceled at pts request.

## 2013-07-05 ENCOUNTER — Telehealth (HOSPITAL_COMMUNITY): Payer: Self-pay | Admitting: *Deleted

## 2013-08-16 ENCOUNTER — Encounter: Payer: Self-pay | Admitting: Internal Medicine

## 2013-08-16 ENCOUNTER — Encounter: Payer: Self-pay | Admitting: *Deleted

## 2013-08-19 ENCOUNTER — Emergency Department (HOSPITAL_COMMUNITY): Payer: Medicare Other

## 2013-08-19 ENCOUNTER — Ambulatory Visit: Payer: Medicare Other | Admitting: Gastroenterology

## 2013-08-19 ENCOUNTER — Inpatient Hospital Stay (HOSPITAL_COMMUNITY)
Admission: EM | Admit: 2013-08-19 | Discharge: 2013-08-23 | DRG: 754 | Disposition: A | Discharge status: Hospice | Payer: Medicare Other | Attending: Internal Medicine | Admitting: Internal Medicine

## 2013-08-19 ENCOUNTER — Encounter (HOSPITAL_COMMUNITY): Payer: Self-pay | Admitting: Emergency Medicine

## 2013-08-19 DIAGNOSIS — Z8719 Personal history of other diseases of the digestive system: Secondary | ICD-10-CM

## 2013-08-19 DIAGNOSIS — Z87891 Personal history of nicotine dependence: Secondary | ICD-10-CM

## 2013-08-19 DIAGNOSIS — I495 Sick sinus syndrome: Secondary | ICD-10-CM | POA: Diagnosis present

## 2013-08-19 DIAGNOSIS — D630 Anemia in neoplastic disease: Secondary | ICD-10-CM | POA: Diagnosis present

## 2013-08-19 DIAGNOSIS — Z950 Presence of cardiac pacemaker: Secondary | ICD-10-CM

## 2013-08-19 DIAGNOSIS — E43 Unspecified severe protein-calorie malnutrition: Secondary | ICD-10-CM | POA: Diagnosis present

## 2013-08-19 DIAGNOSIS — E236 Other disorders of pituitary gland: Secondary | ICD-10-CM | POA: Diagnosis present

## 2013-08-19 DIAGNOSIS — R062 Wheezing: Secondary | ICD-10-CM

## 2013-08-19 DIAGNOSIS — C569 Malignant neoplasm of unspecified ovary: Principal | ICD-10-CM | POA: Diagnosis present

## 2013-08-19 DIAGNOSIS — I2510 Atherosclerotic heart disease of native coronary artery without angina pectoris: Secondary | ICD-10-CM | POA: Diagnosis present

## 2013-08-19 DIAGNOSIS — E871 Hypo-osmolality and hyponatremia: Secondary | ICD-10-CM | POA: Diagnosis present

## 2013-08-19 DIAGNOSIS — I4891 Unspecified atrial fibrillation: Secondary | ICD-10-CM | POA: Diagnosis present

## 2013-08-19 DIAGNOSIS — Z7982 Long term (current) use of aspirin: Secondary | ICD-10-CM

## 2013-08-19 DIAGNOSIS — I10 Essential (primary) hypertension: Secondary | ICD-10-CM | POA: Diagnosis present

## 2013-08-19 DIAGNOSIS — E785 Hyperlipidemia, unspecified: Secondary | ICD-10-CM | POA: Diagnosis present

## 2013-08-19 DIAGNOSIS — Z951 Presence of aortocoronary bypass graft: Secondary | ICD-10-CM

## 2013-08-19 DIAGNOSIS — I6529 Occlusion and stenosis of unspecified carotid artery: Secondary | ICD-10-CM | POA: Diagnosis present

## 2013-08-19 DIAGNOSIS — R188 Other ascites: Secondary | ICD-10-CM | POA: Diagnosis present

## 2013-08-19 DIAGNOSIS — K219 Gastro-esophageal reflux disease without esophagitis: Secondary | ICD-10-CM | POA: Diagnosis present

## 2013-08-19 DIAGNOSIS — R627 Adult failure to thrive: Secondary | ICD-10-CM | POA: Diagnosis present

## 2013-08-19 DIAGNOSIS — Z79899 Other long term (current) drug therapy: Secondary | ICD-10-CM

## 2013-08-19 DIAGNOSIS — Z801 Family history of malignant neoplasm of trachea, bronchus and lung: Secondary | ICD-10-CM

## 2013-08-19 DIAGNOSIS — Z515 Encounter for palliative care: Secondary | ICD-10-CM

## 2013-08-19 DIAGNOSIS — IMO0002 Reserved for concepts with insufficient information to code with codable children: Secondary | ICD-10-CM

## 2013-08-19 DIAGNOSIS — Z66 Do not resuscitate: Secondary | ICD-10-CM | POA: Diagnosis present

## 2013-08-19 DIAGNOSIS — N838 Other noninflammatory disorders of ovary, fallopian tube and broad ligament: Secondary | ICD-10-CM

## 2013-08-19 DIAGNOSIS — E119 Type 2 diabetes mellitus without complications: Secondary | ICD-10-CM | POA: Diagnosis present

## 2013-08-19 DIAGNOSIS — H409 Unspecified glaucoma: Secondary | ICD-10-CM | POA: Diagnosis present

## 2013-08-19 LAB — CBC WITH DIFFERENTIAL/PLATELET
Basophils Absolute: 0.1 10*3/uL (ref 0.0–0.1)
Basophils Relative: 1 % (ref 0–1)
Eosinophils Absolute: 0.1 10*3/uL (ref 0.0–0.7)
Eosinophils Relative: 2 % (ref 0–5)
HCT: 29 % — ABNORMAL LOW (ref 36.0–46.0)
HEMOGLOBIN: 9.8 g/dL — AB (ref 12.0–15.0)
LYMPHS ABS: 0.6 10*3/uL — AB (ref 0.7–4.0)
LYMPHS PCT: 11 % — AB (ref 12–46)
MCH: 27.6 pg (ref 26.0–34.0)
MCHC: 33.8 g/dL (ref 30.0–36.0)
MCV: 81.7 fL (ref 78.0–100.0)
MONOS PCT: 13 % — AB (ref 3–12)
Monocytes Absolute: 0.8 10*3/uL (ref 0.1–1.0)
NEUTROS ABS: 4.2 10*3/uL (ref 1.7–7.7)
NEUTROS PCT: 74 % (ref 43–77)
PLATELETS: 272 10*3/uL (ref 150–400)
RBC: 3.55 MIL/uL — AB (ref 3.87–5.11)
RDW: 14.2 % (ref 11.5–15.5)
WBC: 5.8 10*3/uL (ref 4.0–10.5)

## 2013-08-19 LAB — URINALYSIS, ROUTINE W REFLEX MICROSCOPIC
BILIRUBIN URINE: NEGATIVE
Glucose, UA: NEGATIVE mg/dL
HGB URINE DIPSTICK: NEGATIVE
KETONES UR: NEGATIVE mg/dL
NITRITE: NEGATIVE
PH: 6.5 (ref 5.0–8.0)
Protein, ur: NEGATIVE mg/dL
SPECIFIC GRAVITY, URINE: 1.005 (ref 1.005–1.030)
Urobilinogen, UA: 0.2 mg/dL (ref 0.0–1.0)

## 2013-08-19 LAB — BASIC METABOLIC PANEL
BUN: 12 mg/dL (ref 6–23)
CALCIUM: 8 mg/dL — AB (ref 8.4–10.5)
CO2: 21 mEq/L (ref 19–32)
Chloride: 88 mEq/L — ABNORMAL LOW (ref 96–112)
Creatinine, Ser: 0.71 mg/dL (ref 0.50–1.10)
GFR calc Af Amer: 85 mL/min — ABNORMAL LOW (ref >90)
GFR, EST NON AFRICAN AMERICAN: 73 mL/min — AB (ref >90)
GLUCOSE: 105 mg/dL — AB (ref 70–99)
Potassium: 4 mEq/L (ref 3.7–5.3)
SODIUM: 123 meq/L — AB (ref 137–147)

## 2013-08-19 LAB — URINE MICROSCOPIC-ADD ON

## 2013-08-19 LAB — COMPREHENSIVE METABOLIC PANEL
ALK PHOS: 72 U/L (ref 39–117)
ALT: 8 U/L (ref 0–35)
AST: 31 U/L (ref 0–37)
Albumin: 2.7 g/dL — ABNORMAL LOW (ref 3.5–5.2)
BILIRUBIN TOTAL: 0.4 mg/dL (ref 0.3–1.2)
BUN: 16 mg/dL (ref 6–23)
CHLORIDE: 85 meq/L — AB (ref 96–112)
CO2: 22 mEq/L (ref 19–32)
Calcium: 8.4 mg/dL (ref 8.4–10.5)
Creatinine, Ser: 0.8 mg/dL (ref 0.50–1.10)
GFR calc non Af Amer: 63 mL/min — ABNORMAL LOW (ref >90)
GFR, EST AFRICAN AMERICAN: 72 mL/min — AB (ref >90)
GLUCOSE: 104 mg/dL — AB (ref 70–99)
POTASSIUM: 4.7 meq/L (ref 3.7–5.3)
SODIUM: 120 meq/L — AB (ref 137–147)
TOTAL PROTEIN: 6.9 g/dL (ref 6.0–8.3)

## 2013-08-19 LAB — LIPASE, BLOOD: LIPASE: 36 U/L (ref 11–59)

## 2013-08-19 LAB — LACTIC ACID, PLASMA: Lactic Acid, Venous: 2.1 mmol/L (ref 0.5–2.2)

## 2013-08-19 MED ORDER — SODIUM CHLORIDE 0.9 % IV BOLUS (SEPSIS)
500.0000 mL | Freq: Once | INTRAVENOUS | Status: AC
  Administered 2013-08-19: 500 mL via INTRAVENOUS

## 2013-08-19 MED ORDER — IOHEXOL 300 MG/ML  SOLN
80.0000 mL | Freq: Once | INTRAMUSCULAR | Status: AC | PRN
  Administered 2013-08-19: 80 mL via INTRAVENOUS

## 2013-08-19 MED ORDER — DEXTROSE-NACL 5-0.9 % IV SOLN
INTRAVENOUS | Status: DC
  Administered 2013-08-21: 75 mL/h via INTRAVENOUS
  Administered 2013-08-22: 07:00:00 via INTRAVENOUS

## 2013-08-19 MED ORDER — LISINOPRIL 20 MG PO TABS
20.0000 mg | ORAL_TABLET | Freq: Every day | ORAL | Status: DC
  Administered 2013-08-20 – 2013-08-22 (×3): 20 mg via ORAL
  Filled 2013-08-19 (×4): qty 1

## 2013-08-19 MED ORDER — SODIUM CHLORIDE 0.9 % IV SOLN
Freq: Once | INTRAVENOUS | Status: AC
  Administered 2013-08-19: 11:00:00 via INTRAVENOUS

## 2013-08-19 MED ORDER — NITROGLYCERIN 0.4 MG SL SUBL: 0.4000 mg | SUBLINGUAL_TABLET | SUBLINGUAL | Status: DC | PRN

## 2013-08-19 MED ORDER — AMLODIPINE BESYLATE 5 MG PO TABS
5.0000 mg | ORAL_TABLET | Freq: Every day | ORAL | Status: DC
  Administered 2013-08-20 – 2013-08-22 (×3): 5 mg via ORAL
  Filled 2013-08-19 (×4): qty 1

## 2013-08-19 MED ORDER — DEXTROSE-NACL 5-0.9 % IV SOLN: INTRAVENOUS | Status: DC

## 2013-08-19 MED ORDER — IOHEXOL 300 MG/ML  SOLN
50.0000 mL | Freq: Once | INTRAMUSCULAR | Status: AC | PRN
  Administered 2013-08-19: 50 mL via ORAL

## 2013-08-19 MED ORDER — ONDANSETRON HCL 4 MG PO TABS: 4.0000 mg | ORAL_TABLET | Freq: Four times a day (QID) | ORAL | Status: DC | PRN

## 2013-08-19 MED ORDER — DRONABINOL 2.5 MG PO CAPS
2.5000 mg | ORAL_CAPSULE | Freq: Two times a day (BID) | ORAL | Status: DC
  Administered 2013-08-19 – 2013-08-20 (×2): 2.5 mg via ORAL
  Filled 2013-08-19 (×2): qty 1

## 2013-08-19 MED ORDER — ONDANSETRON HCL 4 MG/2ML IJ SOLN: 4.0000 mg | Freq: Four times a day (QID) | INTRAMUSCULAR | Status: DC | PRN

## 2013-08-19 MED ORDER — DEXTROSE-NACL 5-0.9 % IV SOLN
INTRAVENOUS | Status: AC
  Administered 2013-08-19 – 2013-08-20 (×2): 100 mL/h via INTRAVENOUS

## 2013-08-19 MED ORDER — LATANOPROST 0.005 % OP SOLN
1.0000 [drp] | Freq: Every day | OPHTHALMIC | Status: DC
  Administered 2013-08-19 – 2013-08-22 (×4): 1 [drp] via OPHTHALMIC
  Filled 2013-08-19: qty 2.5

## 2013-08-19 MED ORDER — ADULT MULTIVITAMIN W/MINERALS CH
1.0000 | ORAL_TABLET | Freq: Every day | ORAL | Status: DC
  Administered 2013-08-19 – 2013-08-23 (×5): 1 via ORAL
  Filled 2013-08-19 (×5): qty 1

## 2013-08-19 MED ORDER — HEPARIN SODIUM (PORCINE) 5000 UNIT/ML IJ SOLN
5000.0000 [IU] | Freq: Three times a day (TID) | INTRAMUSCULAR | Status: DC
  Administered 2013-08-19 – 2013-08-22 (×10): 5000 [IU] via SUBCUTANEOUS
  Filled 2013-08-19 (×14): qty 1

## 2013-08-19 MED ORDER — PROPRANOLOL HCL 10 MG PO TABS
10.0000 mg | ORAL_TABLET | Freq: Two times a day (BID) | ORAL | Status: DC
  Administered 2013-08-19 – 2013-08-23 (×8): 10 mg via ORAL
  Filled 2013-08-19 (×10): qty 1

## 2013-08-19 MED ORDER — ASPIRIN 325 MG PO TABS
325.0000 mg | ORAL_TABLET | Freq: Every day | ORAL | Status: DC
  Administered 2013-08-20 – 2013-08-23 (×4): 325 mg via ORAL
  Filled 2013-08-19 (×4): qty 1

## 2013-08-19 NOTE — ED Provider Notes (Signed)
CSN: [id number]     Arrival date & time 08/19/13  [id number] History   First MD Initiated Contact with Patient 08/19/13 [phone]     Chief Complaint  Patient presents with  . Loss of Appetite      (Consider location/radiation/quality/duration/timing/severity/associated sxs/prior Treatment) HPI  This a 80 year old female with a history of CABG, pacemaker, atrial fibrillation, type 2 diabetes who presents with loss of appetite. Patient presents with her family. Patient has had a 2-3 month history of decreased by mouth intake. She states "I can eat a couple bites but then still felt like eating anymore." She was seen by her primary physician on Monday and diagnosed with walking pneumonia. She's currently taking azithromycin. She denies any chest pain or shortness of breath but does endorse a dry cough. Patient has also noted abdominal distention. She denies any abdominal pain. She reports normal bowel movements daily. She denies any urinary symptoms but was recently diagnosed and treated for a urinary tract infection. She denies any fevers. She scheduled to see GI later today.  Past Medical History  Diagnosis Date  . Chest pain 09-25-2009    stress test ,post stress test EF is 91 %, global left  ventricular systolic function is normal, EKG is neg. for ishemia. this is considered a low risk scan.  . S/P CABG x 5 2007    known cardiac disease  . SSS (sick sinus syndrome)   . Pacemaker 2007    dual chamber st. jude victory placed for sinus node dysfunction  . AF (atrial fibrillation)   . H/O cardiac catheterization 2007    permanent pacemaker upgrade with atrial lead placement and pulse generator replacement of grade DDD mode.  Marland Kitchen CAD (coronary artery disease)   . Aortic aneurysm   . Hyperlipidemia   . Type 2 diabetes mellitus     diet controlled  . Carotid artery stenosis     carotid duplex 04-15-2012  . Diabetes mellitus, type 2   . Diverticulosis of colon (without mention of hemorrhage)   .  Glaucoma    Past Surgical History  Procedure Laterality Date  . Coronary artery bypass graft    . Pacemaker insertion    . Back surgery      x 3  . Tonsillectomy and adenoidectomy    . Appendectomy     Family History  Problem Relation Age of Onset  . Lung cancer Sister    History  Substance Use Topics  . Smoking status: Former Research scientist (life sciences)  . Smokeless tobacco: Never Used     Comment: quit smoking 1977  . Alcohol Use: No   OB History   Grav Para Term Preterm Abortions TAB SAB Ect Mult Living                 Review of Systems  Constitutional: Negative for fever.  Respiratory: Positive for cough. Negative for chest tightness and shortness of breath.   Cardiovascular: Negative for chest pain.  Gastrointestinal: Negative for nausea, vomiting, abdominal pain, diarrhea and constipation.       Anorexia, bloating  Genitourinary: Negative for dysuria.  Musculoskeletal: Negative for back pain.  Skin: Negative for rash.  Neurological: Negative for dizziness and headaches.  Psychiatric/Behavioral: Negative for confusion.  All other systems reviewed and are negative.      Allergies  Codeine; Statins; Levothyroxine; and Metformin and related  Home Medications   No current outpatient prescriptions on file. BP 143/59  Pulse 84  Temp(Src) 97.6 F (36.4 C) (Oral)  Resp 20  Ht 5\' 3"  (1.6 m)  Wt 110 lb (49.896 kg)  BMI 19.49 kg/m2  SpO2 99% Physical Exam  Nursing note and vitals reviewed. Constitutional: She is oriented to person, place, and time. No distress.  Elderly, thin  HENT:  Head: Normocephalic and atraumatic.  Mouth/Throat: Oropharynx is clear and moist.  Eyes: Pupils are equal, round, and reactive to light.  Neck: Neck supple.  Cardiovascular: Normal rate, regular rhythm and normal heart sounds.   No murmur heard. Pulmonary/Chest: Effort normal and breath sounds normal. No respiratory distress. She has no wheezes.  Abdominal: Soft. She exhibits no mass. There is  no tenderness. There is no rebound and no guarding.  Distended but soft, hyperactive bowel sounds  Musculoskeletal: She exhibits no edema.  Neurological: She is alert and oriented to person, place, and time.  Skin: Skin is warm and dry.  Psychiatric: She has a normal mood and affect.    ED Course  Procedures (including critical care time) Labs Review Labs Reviewed  CBC WITH DIFFERENTIAL - Abnormal; Notable for the following:    RBC 3.55 (*)    Hemoglobin 9.8 (*)    HCT 29.0 (*)    Lymphocytes Relative 11 (*)    Lymphs Abs 0.6 (*)    Monocytes Relative 13 (*)    All other components within normal limits  COMPREHENSIVE METABOLIC PANEL - Abnormal; Notable for the following:    Sodium 120 (*)    Chloride 85 (*)    Glucose, Bld 104 (*)    Albumin 2.7 (*)    GFR calc non Af Amer 63 (*)    GFR calc Af Amer 72 (*)    All other components within normal limits  URINALYSIS, ROUTINE W REFLEX MICROSCOPIC - Abnormal; Notable for the following:    Leukocytes, UA SMALL (*)    All other components within normal limits  URINE MICROSCOPIC-ADD ON - Abnormal; Notable for the following:    Squamous Epithelial / LPF FEW (*)    All other components within normal limits  BASIC METABOLIC PANEL - Abnormal; Notable for the following:    Sodium 123 (*)    Chloride 88 (*)    Glucose, Bld 105 (*)    Calcium 8.0 (*)    GFR calc non Af Amer 73 (*)    GFR calc Af Amer 85 (*)    All other components within normal limits  LIPASE, BLOOD  LACTIC ACID, PLASMA  BASIC METABOLIC PANEL  COMPREHENSIVE METABOLIC PANEL  CBC  BASIC METABOLIC PANEL   Imaging Review Ct Abdomen Pelvis W Contrast  08/19/2013   CLINICAL DATA:  Decreased appetite over the past few months. Abdominal bloating.  EXAM: CT ABDOMEN AND PELVIS WITH CONTRAST  TECHNIQUE: Multidetector CT imaging of the abdomen and pelvis was performed using the standard protocol following bolus administration of intravenous contrast.  CONTRAST:  80 mL OMNIPAQUE  IOHEXOL 300 MG/ML  SOLN  COMPARISON:  Chest in two views abdomen earlier this same day and two views abdomen 08/15/2013.  FINDINGS: Trace left pleural effusion is present. There is a small to moderate right pleural effusion. There is some dependent atelectasis in the bases.  There is a large volume of abdominal ascites. A large mass is seen in the pelvis measuring 14.5 cm AP x 13.6 cm transverse x 6.5 cm craniocaudal, likely ovarian in origin. Tumor implants are identified in bilateral inguinal hernias. The lesion in the left inguinal hernia measures 4.6 x 4.5 cm on image  80. Multiple tumor implants are seen along the the capsular surface of the liver. Index lesion on image 25 measures 2.2 cm in diameter.  The liver, spleen, adrenal glands and pancreas are unremarkable. Gallstones are noted. Low attenuating lesions in both kidneys cannot be definitively characterized but are likely cysts. There is mild fullness of the renal collecting systems bilaterally. Extensive aortoiliac atherosclerosis is noted. No lytic or sclerotic bony lesion is identified. The patient is status post L4-5 fusion. There is a compression fracture of L1 which appears remote.  IMPRESSION: Findings most consistent with ovarian carcinoma with a large pelvic mass identified, multiple tumor implants in the peritoneum and a large volume of ascites.  Gallstones.  Critical Value/emergent results were called by telephone at the time of interpretation on 08/19/2013 at 1:26 PM to Dr. Thayer Jew , who verbally acknowledged these results.   Electronically Signed   By: Inge Rise M.D.   On: 08/19/2013 13:28   Dg Abd Acute W/chest  08/19/2013   CLINICAL DATA:  Abdominal distention and diarrhea  EXAM: ACUTE ABDOMEN SERIES (ABDOMEN 2 VIEW & CHEST 1 VIEW)  COMPARISON:  August 15, 2013  FINDINGS: PA chest: There is scarring in the right base with stable patchy infiltrate in the right base. There is underlying emphysema. Heart size is normal. Pulmonary  vascularity reflects underlying emphysema. Pacemaker leads are attached to the right atrium right ventricle. There is extensive atherosclerotic change in the aorta.  Supine and left lateral decubitus abdomen. The bowel gas pattern is unremarkable. No obstruction or free air. There are multiple gallstones in the gallbladder region. There is extensive atherosclerotic change in the aorta and iliac arteries. There is postoperative change in the lumbar spine. There is advanced arthropathy in the lumbar spine.  IMPRESSION: Persistent scarring and patchy infiltrate right base. No new lung opacity. Underlying emphysema.  Bowel gas pattern unremarkable. There is cholelithiasis. There is extensive arterial vascular calcification.   Electronically Signed   By: Lowella Grip M.D.   On: 08/19/2013 08:59     EKG Interpretation   Date/Time:  Friday August 19 2013 09:06:55 EST Ventricular Rate:  70 PR Interval:  201 QRS Duration: 133 QT Interval:  410 QTC Calculation: 442 R Axis:   88 Text Interpretation:  Atrial-paced complexes Right bundle branch block  Baseline wander in lead(s) V6 No significant change since last tracing  Confirmed by Reece Fehnel  MD, Auguste Tebbetts ([id number]) on 08/19/2013 9:09:33 AM      MDM   Final diagnoses:  Hyponatremia  FTT (failure to thrive) in adult  Ovarian mass  80 yo who presents with FTT.  Nontoxic on exam.  Notable abdominal distention with fluid wave.  Labs notable for hyponatremia.  Patient given NS bolus and maintenance fluids. CT concerning for metastatic ovarian carcinoma.  Patient and family updated.  Will admit for hyponatremia and further management.    Merryl Hacker, MD 08/19/13 [phone]

## 2013-08-19 NOTE — ED Notes (Signed)
[facility] EDP made aware of CRITICAL NA LEVEL of 120.

## 2013-08-19 NOTE — ED Notes (Signed)
Per family pt has had loss of appetite over past few months. Pt was recently diagnoses with pneumonia on Monday per PCP and currently taking ZPak. Pt has appointment with gastroenterologist this afternoon for bloating.

## 2013-08-19 NOTE — H&P (Signed)
[facility] History and Physical  NUSRAT ENCARNACION CWC:[id number] DOB: 11/19/1919 DOA: 08/19/2013  Referring physician: Dr. Dina Rich PCP: Horatio Pel, MD   Chief Complaint: Decreased appetite and increased weakness  HPI: Lisa Marquez is a 80 y.o. female  Presenting with 2 week complaint of decrease in appetite and subsequent weakness which is getting worse. Patient lives at home and at baseline was able to drive and take care of herself per my discussion with the sons at bedside.  She states that the problem has been getting worse. Nothing she is aware of makes it better.  Given persistence and worsening of problems patient decided to come to the hospital for further evaluation and recommendations.  While in the ED patient has found to be hyponatremic.  She has had abdominal distension for awhile and given her presentation it was decided to obtain CT of abdomen which reported ovarian cancer.  Findings were discussed with patient.   Review of Systems:  14 point review of system reviewed and negative unless otherwise mentioned above.  Past Medical History  Diagnosis Date  . Chest pain 09-25-2009    stress test ,post stress test EF is 91 %, global left  ventricular systolic function is normal, EKG is neg. for ishemia. this is considered a low risk scan.  . S/P CABG x 5 2007    known cardiac disease  . SSS (sick sinus syndrome)   . Pacemaker 2007    dual chamber st. jude victory placed for sinus node dysfunction  . AF (atrial fibrillation)   . H/O cardiac catheterization 2007    permanent pacemaker upgrade with atrial lead placement and pulse generator replacement of grade DDD mode.  Marland Marquez CAD (coronary artery disease)   . Aortic aneurysm   . Hyperlipidemia   . Type 2 diabetes mellitus     diet controlled  . Carotid artery stenosis     carotid duplex 04-15-2012  . Diabetes mellitus, type 2   . Diverticulosis of colon (without mention of hemorrhage)   . Glaucoma    Past  Surgical History  Procedure Laterality Date  . Coronary artery bypass graft    . Pacemaker insertion    . Back surgery      x 3  . Tonsillectomy and adenoidectomy    . Appendectomy     Social History:  reports that she has quit smoking. She has never used smokeless tobacco. She reports that she does not drink alcohol or use illicit drugs.  Allergies  Allergen Reactions  . Codeine     Unknown   . Statins Other (See Comments)    Weakness in legs, joint pain   . Levothyroxine Anxiety  . Metformin And Related Rash    Family History  Problem Relation Age of Onset  . Lung cancer Sister      Prior to Admission medications   Medication Sig Start Date End Date Taking? Authorizing Provider  amLODipine (NORVASC) 10 MG tablet Take 5-10 mg by mouth daily.    Yes Historical Provider, MD  aspirin 325 MG tablet Take 325 mg by mouth daily.   Yes Historical Provider, MD  azithromycin (ZITHROMAX Z-PAK) 250 MG tablet Take 250-500 mg by mouth See admin instructions. Takes 2 tablets on first day, then 1 tablet qd for 5 days 08/16/13  Yes Historical Provider, MD  brimonidine (ALPHAGAN P) 0.1 % SOLN Place 1 drop into the right eye daily.   Yes Historical Provider, MD  latanoprost (XALATAN) 0.005 % ophthalmic solution  Place 1 drop into both eyes at bedtime.   Yes Historical Provider, MD  lisinopril (PRINIVIL,ZESTRIL) 20 MG tablet Take 20 mg by mouth daily.   Yes Historical Provider, MD  nitroGLYCERIN (NITROSTAT) 0.4 MG SL tablet Place 0.4 mg under the tongue every 5 (five) minutes as needed for chest pain.   Yes Historical Provider, MD  polyethylene glycol (MIRALAX / GLYCOLAX) packet Take 17 g by mouth daily.   Yes Historical Provider, MD  propranolol (INDERAL) 20 MG tablet Take 10 mg by mouth 2 (two) times daily.   Yes Historical Provider, MD  Wheat Dextrin (BENEFIBER DRINK MIX) PACK Take 1 packet by mouth daily after breakfast.   Yes Historical Provider, MD   Physical Exam: Filed Vitals:   08/19/13  1340  BP: 129/63  Pulse:   Temp:   Resp: 17    BP 129/63  Pulse 78  Temp(Src) 97.7 F (36.5 C) (Oral)  Resp 17  SpO2 96%  General:  Appears calm and comfortable Eyes: PERRL, normal lids, irises & conjunctiva, dry mucous membranes ENT: grossly normal hearing, lips & tongue Neck: no LAD, masses or thyromegaly Cardiovascular: RRR, no rubs Respiratory: CTA bilaterally, no w/r/r. Normal respiratory effort. Abdomen: soft, distended, NT Skin: no rash or induration seen on limited exam Musculoskeletal: grossly normal tone BUE/BLE Psychiatric: grossly normal mood and affect, speech fluent and appropriate Neurologic: No facial asymmetry, moves all extremities equally.          Labs on Admission:  Basic Metabolic Panel:  Recent Labs Lab 08/19/13 0904  NA 120*  K 4.7  CL 85*  CO2 22  GLUCOSE 104*  BUN 16  CREATININE 0.80  CALCIUM 8.4   Liver Function Tests:  Recent Labs Lab 08/19/13 0904  AST 31  ALT 8  ALKPHOS 72  BILITOT 0.4  PROT 6.9  ALBUMIN 2.7*    Recent Labs Lab 08/19/13 0904  LIPASE 36   No results found for this basename: AMMONIA,  in the last 168 hours CBC:  Recent Labs Lab 08/19/13 0904  WBC 5.8  NEUTROABS 4.2  HGB 9.8*  HCT 29.0*  MCV 81.7  PLT 272   Cardiac Enzymes: No results found for this basename: CKTOTAL, CKMB, CKMBINDEX, TROPONINI,  in the last 168 hours  BNP (last 3 results) No results found for this basename: PROBNP,  in the last 8760 hours CBG: No results found for this basename: GLUCAP,  in the last 168 hours  Radiological Exams on Admission: Ct Abdomen Pelvis W Contrast  08/19/2013   CLINICAL DATA:  Decreased appetite over the past few months. Abdominal bloating.  EXAM: CT ABDOMEN AND PELVIS WITH CONTRAST  TECHNIQUE: Multidetector CT imaging of the abdomen and pelvis was performed using the standard protocol following bolus administration of intravenous contrast.  CONTRAST:  80 mL OMNIPAQUE IOHEXOL 300 MG/ML  SOLN   COMPARISON:  Chest in two views abdomen earlier this same day and two views abdomen 08/15/2013.  FINDINGS: Trace left pleural effusion is present. There is a small to moderate right pleural effusion. There is some dependent atelectasis in the bases.  There is a large volume of abdominal ascites. A large mass is seen in the pelvis measuring 14.5 cm AP x 13.6 cm transverse x 6.5 cm craniocaudal, likely ovarian in origin. Tumor implants are identified in bilateral inguinal hernias. The lesion in the left inguinal hernia measures 4.6 x 4.5 cm on image 80. Multiple tumor implants are seen along the the capsular surface of the liver.  Index lesion on image 25 measures 2.2 cm in diameter.  The liver, spleen, adrenal glands and pancreas are unremarkable. Gallstones are noted. Low attenuating lesions in both kidneys cannot be definitively characterized but are likely cysts. There is mild fullness of the renal collecting systems bilaterally. Extensive aortoiliac atherosclerosis is noted. No lytic or sclerotic bony lesion is identified. The patient is status post L4-5 fusion. There is a compression fracture of L1 which appears remote.  IMPRESSION: Findings most consistent with ovarian carcinoma with a large pelvic mass identified, multiple tumor implants in the peritoneum and a large volume of ascites.  Gallstones.  Critical Value/emergent results were called by telephone at the time of interpretation on 08/19/2013 at 1:26 PM to Dr. Thayer Jew , who verbally acknowledged these results.   Electronically Signed   By: Inge Rise M.D.   On: 08/19/2013 13:28   Dg Abd Acute W/chest  08/19/2013   CLINICAL DATA:  Abdominal distention and diarrhea  EXAM: ACUTE ABDOMEN SERIES (ABDOMEN 2 VIEW & CHEST 1 VIEW)  COMPARISON:  August 15, 2013  FINDINGS: PA chest: There is scarring in the right base with stable patchy infiltrate in the right base. There is underlying emphysema. Heart size is normal. Pulmonary vascularity reflects  underlying emphysema. Pacemaker leads are attached to the right atrium right ventricle. There is extensive atherosclerotic change in the aorta.  Supine and left lateral decubitus abdomen. The bowel gas pattern is unremarkable. No obstruction or free air. There are multiple gallstones in the gallbladder region. There is extensive atherosclerotic change in the aorta and iliac arteries. There is postoperative change in the lumbar spine. There is advanced arthropathy in the lumbar spine.  IMPRESSION: Persistent scarring and patchy infiltrate right base. No new lung opacity. Underlying emphysema.  Bowel gas pattern unremarkable. There is cholelithiasis. There is extensive arterial vascular calcification.   Electronically Signed   By: Lowella Grip M.D.   On: 08/19/2013 08:59    EKG: Independently reviewed. Atrial paced complexes with inverted T waves at precordial leads. No prior EKG for comparison  Assessment/Plan Active Problems:  Failure to thrive in adult - Most likely due to new diagnosis of ovarian cancer - Marinol for appetite stimulation - D 5 normal saline administration -  Liberalize diet  Ovarian cancer - Patient does not wish for any further investigation at this point per our discussion.  Is at peace with news and would like to carry out whats left of her life without any further testing or surgeries.  Hyponatremia - normal saline administration - Will monitor BMP q 6 hours.  Will look to correct sodium levels no more than 10 meq in a 24 hour period  DVT prophylaxis: Heparin  Time spent: > 35 minutes  Velvet Bathe [facility] Pager [phone]

## 2013-08-19 NOTE — ED Notes (Signed)
Pt assisted to bedside commode to void.

## 2013-08-19 NOTE — Progress Notes (Signed)
Clinical Social Work Department CLINICAL SOCIAL WORK PLACEMENT NOTE 08/19/2013  Patient:  Lisa Marquez, Lisa Marquez  Account Number:  [id number] Admit date:  08/19/2013  Clinical Social Worker:  Ulyess Blossom  Date/time:  08/19/2013 05:47 PM  Clinical Social Work is seeking post-discharge placement for this patient at the following level of care:   SKILLED NURSING   (*CSW will update this form in Epic as items are completed)   08/19/2013  Patient/family provided with [place] Department of Clinical Social Work's list of facilities offering this level of care within the geographic area requested by the patient (or if unable, by the patient's family).  08/19/2013  Patient/family informed of their freedom to choose among providers that offer the needed level of care, that participate in Medicare, Medicaid or managed care program needed by the patient, have an available bed and are willing to accept the patient.  08/19/2013  Patient/family informed of [facility]' ownership interest in [facility], as well as of the fact that they are under no obligation to receive care at this facility.  PASARR submitted to EDS on 08/19/2013 PASARR number received from EDS on 08/19/2013  FL2 transmitted to all facilities in geographic area requested by pt/family on  08/19/2013 FL2 transmitted to all facilities within larger geographic area on   Patient informed that his/her managed care company has contracts with or will negotiate with  certain facilities, including the following:     Patient/family informed of bed offers received:   Patient chooses bed at  Physician recommends and patient chooses bed at    Patient to be transferred to  on   Patient to be transferred to facility by   The following physician request were entered in Epic:   Additional Comments:   Alison Murray, MSW, [place] [facility] [phone]

## 2013-08-19 NOTE — ED Notes (Signed)
Pt transferred to CT.

## 2013-08-19 NOTE — ED Notes (Signed)
Pt made aware of need for urine specimen 

## 2013-08-19 NOTE — ED Notes (Signed)
CT made aware pt has drank ALL contrast.

## 2013-08-19 NOTE — Progress Notes (Signed)
Clinical Social Work Department BRIEF PSYCHOSOCIAL ASSESSMENT 08/19/2013  Patient:  Lisa Marquez, Lisa Marquez     Account Number:  [id number]     Admit date:  08/19/2013  Clinical Social Worker:  Ulyess Blossom  Date/Time:  08/19/2013 05:00 PM  Referred by:  Physician  Date Referred:  08/19/2013 Referred for  SNF Placement   Other Referral:   Interview type:  Patient Other interview type:   and patient family at bedside    PSYCHOSOCIAL DATA Living Status:  ALONE Admitted from facility:   Level of care:   Primary support name:  Clair Gulling Aceituno/[phone] Primary support relationship to patient:  CHILD, ADULT Degree of support available:   strong    CURRENT CONCERNS Current Concerns  Post-Acute Placement   Other Concerns:    SOCIAL WORK ASSESSMENT / PLAN CSW received referral from RN that pt admitted today, but pt family requesting to speak with CSW.    CSW met with pt and pt two sons and daughter-in-law at bedside. CSW introduced self and explained role. Pt family discussed that pt lives alone and up until recently pt was driving and able to manage at home alone. Per pt family, pt recently became progressively weaker and pt family brought pt to the ED this morning. CSW provided emotional support as pt and pt family discussed that pt was diagnosed with Ovarian Cancer today. Per pt family, pt has decided that she does not want any heroic measures, but pt family has concerns about pt returning home alone. Pt family are interested in SNF placement.    CSW discussed with pt and pt family that pt is currently under observation status. CSW described Medicare guidelines for SNF and that if pt does not meet inpatient criteria then SNF placement would be private pay. Pt family discussed that pt has a long term care policy and CSW discussed that CSW is unable to determine if the long term care policy will cover placement, but pt family can contact a facility as a facility will be able to determine  if the policy will cover placement. Pt family expressed understanding. CSW discussed that RNCM will complete a utilization review to determine if pt meets inpatient criteria.    Pt and pt family are agreeable to SNF search in [facility] to be aware of options and have private pay rates if pt does not meet inpatient criteria for Medicare coverage at [facility].    CSW completed FL2 and initiated SNF search to [facility] and requested facility provide private pay rates for pt family information.    CSW paged MD to inquire if MD felt PMT GOC would be beneficial for pt and pt family.    Weekend CSW to follow up with pt family regarding determination if pt meets inpatient criteria and continued assistance with disposition plans.   Assessment/plan status:  Psychosocial Support/Ongoing Assessment of Needs Other assessment/ plan:   discharge planning   Information/referral to community resources:   [facility] list    PATIENT'S/FAMILY'S RESPONSE TO PLAN OF CARE: Pt alert and oriented x 4. Pt family very supportive and actively involved in pt care. Pt and pt family appear to be coping appropriately with pt new diagnosis of ovarian cancer, but want to ensure that pt has most appropriate disposition plan as pt is extremely weak. Pt and pt family are hopeful that pt will meet inpatient criteria in order for Medicare to cover SNF.    Alison Murray, MSW, [place] Work [phone]

## 2013-08-20 DIAGNOSIS — R627 Adult failure to thrive: Secondary | ICD-10-CM

## 2013-08-20 DIAGNOSIS — N839 Noninflammatory disorder of ovary, fallopian tube and broad ligament, unspecified: Secondary | ICD-10-CM

## 2013-08-20 DIAGNOSIS — C569 Malignant neoplasm of unspecified ovary: Principal | ICD-10-CM

## 2013-08-20 LAB — BASIC METABOLIC PANEL
BUN: 12 mg/dL (ref 6–23)
BUN: 12 mg/dL (ref 6–23)
BUN: 12 mg/dL (ref 6–23)
CALCIUM: 8.2 mg/dL — AB (ref 8.4–10.5)
CALCIUM: 8.6 mg/dL (ref 8.4–10.5)
CHLORIDE: 91 meq/L — AB (ref 96–112)
CO2: 20 mEq/L (ref 19–32)
CO2: 20 meq/L (ref 19–32)
CO2: 21 mEq/L (ref 19–32)
Calcium: 8.4 mg/dL (ref 8.4–10.5)
Chloride: 89 mEq/L — ABNORMAL LOW (ref 96–112)
Chloride: 91 mEq/L — ABNORMAL LOW (ref 96–112)
Creatinine, Ser: 0.73 mg/dL (ref 0.50–1.10)
Creatinine, Ser: 0.81 mg/dL (ref 0.50–1.10)
Creatinine, Ser: 0.75 mg/dL (ref 0.50–1.10)
GFR calc Af Amer: 71 mL/min — ABNORMAL LOW (ref >90)
GFR calc Af Amer: 83 mL/min — ABNORMAL LOW (ref >90)
GFR calc non Af Amer: 62 mL/min — ABNORMAL LOW (ref >90)
GFR calc non Af Amer: 72 mL/min — ABNORMAL LOW (ref >90)
GFR, EST AFRICAN AMERICAN: 84 mL/min — AB (ref >90)
GFR, EST NON AFRICAN AMERICAN: 72 mL/min — AB (ref >90)
GLUCOSE: 123 mg/dL — AB (ref 70–99)
GLUCOSE: 143 mg/dL — AB (ref 70–99)
GLUCOSE: 146 mg/dL — AB (ref 70–99)
Potassium: 4 mEq/L (ref 3.7–5.3)
Potassium: 3.8 mEq/L (ref 3.7–5.3)
Potassium: 3.6 mEq/L — ABNORMAL LOW (ref 3.7–5.3)
SODIUM: 121 meq/L — AB (ref 137–147)
SODIUM: 125 meq/L — AB (ref 137–147)
Sodium: 126 mEq/L — ABNORMAL LOW (ref 137–147)

## 2013-08-20 LAB — COMPREHENSIVE METABOLIC PANEL
ALBUMIN: 2.3 g/dL — AB (ref 3.5–5.2)
ALT: 5 U/L (ref 0–35)
AST: 26 U/L (ref 0–37)
Alkaline Phosphatase: 67 U/L (ref 39–117)
BUN: 11 mg/dL (ref 6–23)
CO2: 19 mEq/L (ref 19–32)
CREATININE: 0.74 mg/dL (ref 0.50–1.10)
Calcium: 8.3 mg/dL — ABNORMAL LOW (ref 8.4–10.5)
Chloride: 89 mEq/L — ABNORMAL LOW (ref 96–112)
GFR calc Af Amer: 84 mL/min — ABNORMAL LOW (ref >90)
GFR calc non Af Amer: 72 mL/min — ABNORMAL LOW (ref >90)
Glucose, Bld: 115 mg/dL — ABNORMAL HIGH (ref 70–99)
Potassium: 3.8 mEq/L (ref 3.7–5.3)
Sodium: 122 mEq/L — ABNORMAL LOW (ref 137–147)
TOTAL PROTEIN: 6.4 g/dL (ref 6.0–8.3)
Total Bilirubin: 0.4 mg/dL (ref 0.3–1.2)

## 2013-08-20 LAB — CBC
HEMATOCRIT: 27 % — AB (ref 36.0–46.0)
HEMOGLOBIN: 9.2 g/dL — AB (ref 12.0–15.0)
MCH: 27.7 pg (ref 26.0–34.0)
MCHC: 34.1 g/dL (ref 30.0–36.0)
MCV: 81.3 fL (ref 78.0–100.0)
Platelets: 261 10*3/uL (ref 150–400)
RBC: 3.32 MIL/uL — ABNORMAL LOW (ref 3.87–5.11)
RDW: 14.1 % (ref 11.5–15.5)
WBC: 5.4 10*3/uL (ref 4.0–10.5)

## 2013-08-20 MED ORDER — AZITHROMYCIN 250 MG PO TABS
250.0000 mg | ORAL_TABLET | Freq: Every day | ORAL | Status: AC
Start: 2013-08-20 — End: 2013-08-21
  Administered 2013-08-20 – 2013-08-21 (×2): 250 mg via ORAL

## 2013-08-20 MED ORDER — IPRATROPIUM-ALBUTEROL 0.5-2.5 (3) MG/3ML IN SOLN
3.0000 mL | RESPIRATORY_TRACT | Status: DC | PRN
  Administered 2013-08-20 – 2013-08-22 (×4): 3 mL via RESPIRATORY_TRACT
  Filled 2013-08-20 (×4): qty 3

## 2013-08-20 MED ORDER — IPRATROPIUM BROMIDE 0.02 % IN SOLN: 0.5000 mg | RESPIRATORY_TRACT | Status: DC | PRN

## 2013-08-20 MED ORDER — AZITHROMYCIN 250 MG PO TABS
250.0000 mg | ORAL_TABLET | Freq: Every day | ORAL | Status: DC
  Filled 2013-08-20: qty 1

## 2013-08-20 MED ORDER — ALBUTEROL SULFATE (2.5 MG/3ML) 0.083% IN NEBU: 2.5000 mg | INHALATION_SOLUTION | RESPIRATORY_TRACT | Status: DC | PRN

## 2013-08-20 MED ORDER — CALCIUM CARBONATE ANTACID 500 MG PO CHEW
1.0000 | CHEWABLE_TABLET | Freq: Four times a day (QID) | ORAL | Status: DC | PRN
  Administered 2013-08-20: 200 mg via ORAL
  Filled 2013-08-20 (×2): qty 1

## 2013-08-20 MED ORDER — ACETAMINOPHEN 325 MG PO TABS
650.0000 mg | ORAL_TABLET | Freq: Four times a day (QID) | ORAL | Status: DC | PRN
  Administered 2013-08-20: 650 mg via ORAL
  Filled 2013-08-20: qty 2

## 2013-08-20 NOTE — Progress Notes (Signed)
[facility] PROGRESS NOTE  Lisa Marquez XBM:[id number] DOB: 1919-10-26 DOA: 08/19/2013 PCP: Horatio Pel, MD  Brief narrative: 80 y.o. female with past medical history of hypertension, glaucoma who presented to [facility] ED 08/19/2013 with progressive poor oral intake, worsening abdominal distention, weakness and fatigue. Her family confirms the same but pt mental status is very good and she is able to provide details of her medical history.  She was found to have hyponatremia on admission with sodium of 121 and more concerning CT scan findings suggestive of ovarian carcinoma, ovarian mass and large volume ascites. She confirms DNR code status. She says she would not be ready for chemo if indeed this is cancer. In her words:" in my age it makes no sense to have all this done, I'm just happy I lived this long with good mentation". Pt and family very very happy to hear we have palliative care team to help them with goals of care and very appreciative of suggestion to meet with them so order was PCT for GOC was placed.  Assessment/Plan:  Active Problems:   Failure to thrive in adult, generalized weakness, dehydration, severe protein calorie malnutrition in the setting of probable new ovarian cancer - secondary to likely ovarian cancer, ascites - will see how meeting with PCT goes but for now we will provide supportive care with VI fluids, antiemetics, physical therapy - encourage PO intake - continue dronabinol and will add megace   Hyponatremia - secondary to dehydration or SIADH from malignancy - continue IV fluids for now - follow up BMP ina m   Ovarian cancer, large volume ascites - plan for PCT to meet with the family for goals of care - patient was not ready for paracentesis yet   Anemia of chronic disease - likely due to poor nutrition and ovarian ca - hemoglobin 9.2 - no indications for transfusion    Code Status: DNR/DNI Family Communication: 2 sons at the bedside   Disposition Plan: home when stable  Leisa Lenz, MD  [facility] Pager [phone]  If 7PM-7AM, please contact night-coverage [email or web address] Password [email or web address] 08/20/2013, 4:28 PM   LOS: 1 day   Consultants:  IR  Palliative care  Procedures:  None   Antibiotics:  Azithromycin needs to complete 2 more day, last day 08/21/13  HPI/Subjective: No overnight events.   Objective: Filed Vitals:   08/19/13 2147 08/20/13 0546 08/20/13 0942 08/20/13 1352  BP: 108/41 123/49 115/50 120/54  Pulse: 88 88 83 79  Temp: 98.1 F (36.7 C) 98.5 F (36.9 C)  97.7 F (36.5 C)  TempSrc: Oral Oral  Oral  Resp: 18 20 20 18   Height:      Weight:      SpO2: 94% 94% 95% 94%    Intake/Output Summary (Last 24 hours) at 08/20/13 1628 Last data filed at 08/20/13 1300  Gross per 24 hour  Intake    560 ml  Output    200 ml  Net    360 ml    Exam:   General:  Pt is alert, follows commands appropriately, not in acute distress  Cardiovascular: Regular rate and rhythm, S1/S2 appreciated  Respiratory: Clear to auscultation bilaterally, no wheezing, no crackles, no rhonchi  Abdomen: distended with fluid wave, ascites, bowel sounds present, no guarding  Extremities: No edema, pulses DP and PT palpable bilaterally  Neuro: Grossly nonfocal  Data Reviewed: Basic Metabolic Panel:  Recent Labs Lab 08/19/13 0904 08/19/13 1756 08/20/13 0018 08/20/13 6440 08/20/13 1221  NA 120*  123* 121* 122* 126*  K 4.7 4.0 4.0 3.8 3.8  CL 85* 88* 89* 89* 91*  CO2 22 21 20 19 20   GLUCOSE 104* 105* 123* 115* 143*  BUN 16 12 12 11 12   CREATININE 0.80 0.71 0.73 0.74 0.81  CALCIUM 8.4 8.0* 8.2* 8.3* 8.4   Liver Function Tests:  Recent Labs Lab 08/19/13 0904 08/20/13 0632  AST 31 26  ALT 8 5  ALKPHOS 72 67  BILITOT 0.4 0.4  PROT 6.9 6.4  ALBUMIN 2.7* 2.3*    Recent Labs Lab 08/19/13 0904  LIPASE 36   No results found for this basename: AMMONIA,  in the last 168 hours CBC:  Recent  Labs Lab 08/19/13 0904 08/20/13 0632  WBC 5.8 5.4  NEUTROABS 4.2  --   HGB 9.8* 9.2*  HCT 29.0* 27.0*  MCV 81.7 81.3  PLT 272 261   Cardiac Enzymes: No results found for this basename: CKTOTAL, CKMB, CKMBINDEX, TROPONINI,  in the last 168 hours BNP: No components found with this basename: POCBNP,  CBG: No results found for this basename: GLUCAP,  in the last 168 hours  No results found for this or any previous visit (from the past 240 hour(s)).   Studies: Ct Abdomen Pelvis W Contrast 08/19/2013     IMPRESSION: Findings most consistent with ovarian carcinoma with a large pelvic mass identified, multiple tumor implants in the peritoneum and a large volume of ascites.  Gallstones.    Dg Abd Acute W/chest 08/19/2013   IMPRESSION: Persistent scarring and patchy infiltrate right base. No new lung opacity. Underlying emphysema.  Bowel gas pattern unremarkable. There is cholelithiasis. There is extensive arterial vascular calcification.   Electronically Signed   By: Lowella Grip M.D.   On: 08/19/2013 08:59    Scheduled Meds: . amLODipine  5 mg Oral Daily  . aspirin  325 mg Oral Daily  . azithromycin  250 mg Oral Daily  . dronabinol  2.5 mg Oral BID AC  . lisinopril  20 mg Oral Daily  . multivitamin  1 tablet Oral Daily  . propranolol  10 mg Oral BID   Continuous Infusions: . dextrose 5 % and 0.9% NaCl 75 mL/hr at 08/20/13 1248

## 2013-08-20 NOTE — Progress Notes (Signed)
CRITICAL VALUE ALERT  Critical value received:  Sodium 121  Date of notification:  08/20/13  Time of notification:  0120  Critical value read back: yes  Nurse who received alert:  Wilhemina Cash  MD notified (1st page):  On-call, Lynch  Time of first page:  0123  MD notified (2nd page):  Time of second page:  Responding MD:  Donnal Debar  Time MD responded:  6226, no new orders

## 2013-08-20 NOTE — Progress Notes (Signed)
UR completed 

## 2013-08-21 ENCOUNTER — Inpatient Hospital Stay (HOSPITAL_COMMUNITY): Payer: Medicare Other

## 2013-08-21 DIAGNOSIS — E43 Unspecified severe protein-calorie malnutrition: Secondary | ICD-10-CM | POA: Insufficient documentation

## 2013-08-21 LAB — BODY FLUID CELL COUNT WITH DIFFERENTIAL
Eos, Fluid: 0 %
LYMPHS FL: 75 %
Monocyte-Macrophage-Serous Fluid: 15 % — ABNORMAL LOW (ref 50–90)
NEUTROPHIL FLUID: 10 % (ref 0–25)
Total Nucleated Cell Count, Fluid: 2341 cu mm — ABNORMAL HIGH (ref 0–1000)

## 2013-08-21 LAB — BASIC METABOLIC PANEL
BUN: 10 mg/dL (ref 6–23)
BUN: 10 mg/dL (ref 6–23)
BUN: 11 mg/dL (ref 6–23)
CO2: 19 mEq/L (ref 19–32)
CO2: 21 mEq/L (ref 19–32)
CO2: 21 mEq/L (ref 19–32)
CREATININE: 0.71 mg/dL (ref 0.50–1.10)
CREATININE: 0.71 mg/dL (ref 0.50–1.10)
Calcium: 8 mg/dL — ABNORMAL LOW (ref 8.4–10.5)
Calcium: 8 mg/dL — ABNORMAL LOW (ref 8.4–10.5)
Calcium: 8.1 mg/dL — ABNORMAL LOW (ref 8.4–10.5)
Chloride: 91 mEq/L — ABNORMAL LOW (ref 96–112)
Chloride: 93 mEq/L — ABNORMAL LOW (ref 96–112)
Chloride: 95 mEq/L — ABNORMAL LOW (ref 96–112)
Creatinine, Ser: 0.67 mg/dL (ref 0.50–1.10)
GFR calc Af Amer: 85 mL/min — ABNORMAL LOW (ref >90)
GFR calc Af Amer: 86 mL/min — ABNORMAL LOW (ref >90)
GFR calc non Af Amer: 73 mL/min — ABNORMAL LOW (ref >90)
GFR, EST AFRICAN AMERICAN: 85 mL/min — AB (ref >90)
GFR, EST NON AFRICAN AMERICAN: 73 mL/min — AB (ref >90)
GFR, EST NON AFRICAN AMERICAN: 75 mL/min — AB (ref >90)
GLUCOSE: 114 mg/dL — AB (ref 70–99)
Glucose, Bld: 116 mg/dL — ABNORMAL HIGH (ref 70–99)
Glucose, Bld: 118 mg/dL — ABNORMAL HIGH (ref 70–99)
POTASSIUM: 3.4 meq/L — AB (ref 3.7–5.3)
POTASSIUM: 3.5 meq/L — AB (ref 3.7–5.3)
POTASSIUM: 3.7 meq/L (ref 3.7–5.3)
SODIUM: 127 meq/L — AB (ref 137–147)
Sodium: 124 mEq/L — ABNORMAL LOW (ref 137–147)
Sodium: 127 mEq/L — ABNORMAL LOW (ref 137–147)

## 2013-08-21 LAB — PROTEIN, BODY FLUID: Total protein, fluid: 4.5 g/dL

## 2013-08-21 LAB — LACTATE DEHYDROGENASE, PLEURAL OR PERITONEAL FLUID: LD FL: 2241 U/L — AB (ref 3–23)

## 2013-08-21 LAB — ALBUMIN, FLUID (OTHER): ALBUMIN FL: 1.8 g/dL

## 2013-08-21 LAB — GLUCOSE, PERITONEAL FLUID: Glucose, Peritoneal Fluid: 116 mg/dL

## 2013-08-21 MED ORDER — MEGESTROL ACETATE 40 MG/ML PO SUSP
200.0000 mg | Freq: Two times a day (BID) | ORAL | Status: DC
  Administered 2013-08-21: 200 mg via ORAL
  Filled 2013-08-21 (×2): qty 5

## 2013-08-21 MED ORDER — BOOST / RESOURCE BREEZE PO LIQD
1.0000 | Freq: Three times a day (TID) | ORAL | Status: DC
  Administered 2013-08-21 – 2013-08-23 (×4): 1 via ORAL

## 2013-08-21 MED ORDER — PANTOPRAZOLE SODIUM 40 MG PO TBEC
40.0000 mg | DELAYED_RELEASE_TABLET | Freq: Every day | ORAL | Status: DC
  Administered 2013-08-21 – 2013-08-22 (×2): 40 mg via ORAL
  Filled 2013-08-21 (×2): qty 1

## 2013-08-21 NOTE — Progress Notes (Signed)
Thank you for consulting the Palliative Medicine Team at [facility] to meet your patient's and family's needs.   The reason that you asked [place] to see your patient is  For GOC  We have scheduled your patient for a meeting:  3/8 3 pm  The Surrogate decision make is:Son Jim/Bill Contact information: see face sheet  Other family members that need to be present: at their discretion    Your patient is able/unable to participate:   Baylon Santelli L. [name], MD MBA The Palliative Medicine Team at [facility] Phone: [phone] Pager: [phone]

## 2013-08-21 NOTE — Consult Note (Signed)
Patient ID:Lisa Marquez      DOB: Sep 08, 1919      PPI:[id number]  Met with patient Lisa Marquez, Two sons Lisa Marquez and Lisa Marquez and Lisa Marquez's wife  Patient is realistic about the news that she likely has a malignancy as the cause for her recent decline.  She relates that she is 67 and has been independently living since her husband died when she was 22.  She was driving up until a month ago.  Her life is built around her family and her faith having attended [employer] all her life.  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   Lisa Marquez presented with increasing abdomen girth and poor appetite.  CT showed probable ovarian malignancy.   She has told her family that she would not want chemotherapy, but they were curious as to the origin of the cancer and so paracentesis was offered.  In light of the fact that at this moment she does not desire treatment for her condition ( I suppose this could change , but not likely to) we did talk about her goals of care.  She has told her children no feeding tubes, just let her go on and she hopes it will be soon.  She has had a good life . She wants to be cremated and laid to rest with her husband and Lisa Marquez.  She wants to leave this world paying her bills so she can leave a little something for her children.  We talked about using hospice and considering presenting her case to Hospice for placement in their facility as Lisa Marquez is barely eating, and experiencing some significant reflux symptoms.  She has not pain at this time.  She states she is ready to go but her greatest fears would be to be in pain or to suffer.  She has a remarkable attitude and faith that is sustaining her.   Recommend:  1.  DNR affirmed  2.  Stop all appetite stimulant. The marinol made her mouth dry and likely confused her slightly.  The Megace is not going to be that effective and puts her at risk for DVT or other  thrombosis.  If we want to help her appetite the most, acid suppression would likely help the most as she complains of significant reflux especially when laying down.  3.  Stop labs. Their is really no need to pinch her.  Symptomatic treatment is what she desires  4. I will speak with social work in the morning regarding presenting her case to hospice for hospice facility placement She may not meet criteria yet but I want to see if they could have a vision for her future decline as she is not really eating or drinking. 5.  Follow up cytology.  If helpful obtaining information for oncology could assist her in confirming her choice if she wants to have that information.   Total time: 305-400 pm  Lisa Marquez L. Lisa Le, MD MBA The Palliative Medicine Team at [facility] Phone: [phone] Pager: [phone]  Will discuss with Dr. Charlies Silvers ( whom they just adore)

## 2013-08-21 NOTE — Consult Note (Signed)
Patient ID:Lisa Marquez      DOB: August 26, 1919      JSE:[id number]     Consult Note from the Palliative Medicine Team at [place] Requested by:  Dr. Wendee Beavers      PCP: Horatio Pel, MD Reason for Consultation: [place]     Phone [phone] Related symptom recommendations. Assessment of patients Current state: 80 yr old white female presented with several weeks of decreased appetite, general weight loss but abdominal weight gain. In ED CT scan shows pelvic mass likely ovarian in origin. Patient informed MD and family that she would not want any further treatment or workup for this lesion.  She did agree to paracentesis to assist with symptom management and potential diagnosis using analysis for cells.  She expresses her goals as to maintain her cognitive function for as long as she can.  She states she wants to eat what she wants when she wants it and does not want artificial nutrition.  She realizes she can't go home and so we talked about considering hospice facility placement.  Family on board with goals for comfort.   Goals of Care: 1.  Code Status: DNR   2. Scope of Treatment: Focus on symptomatic relief of symptoms.  Stop lab draws.  Follow up on cytology.  Medications only for comfort  4. Disposition: Offer choice for Hospice facility placement   3. Symptom Management: 1. Anorexia, Failure to thrive, severe protein calorie malnutrition:  Stop appetite stimulants they are not going to give the bang for the buck that we need and she states that the marinol made her confused which she does not want to be.  The Megace puts her at high risk for thrombosis especially with a compressive mass. Aggressive treatment of her reflux may help with appetite 2. Reflux: add PPI q day up titrate as needed 3. Wheezing with shortness of breath: may be from reflux with aspiration and pneumonitis, PPI and nebs    4. Psychosocial: Married x1 but he passed away.  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 She has two sons.  5. Spiritual: church is important to her discussed availability of spiritual care and hospice chaplains.        Patient Documents Completed or Given: Document Given Completed  Advanced Directives Pkt    MOST    DNR    Gone from My Sight    Hard Choices      Brief HPI: 80 yr old white female admitted with decreased appetite and weakness.  Ct scan of abdomen shows likely ovarian mass with ascites. We were asked to assist with goals of care   ROS: decreased appetite, no real pain just sense of fullness, intermittent shortness of breath.    PMH:  Past Medical History  Diagnosis Date  . Chest pain 09-25-2009    stress test ,post stress test EF is 91 %, global left  ventricular systolic function is normal, EKG is neg. for ishemia. this is considered a low risk scan.  . S/P CABG x 5 2007    known cardiac disease  . SSS (sick sinus syndrome)   . Pacemaker 2007    dual chamber [facility] victory placed for sinus node dysfunction  . AF (atrial fibrillation)   . H/O cardiac catheterization 2007    permanent pacemaker upgrade with atrial lead placement and pulse generator replacement of grade DDD mode.  Marland Kitchen CAD (coronary artery disease)   .  Aortic aneurysm   . Hyperlipidemia   . Type 2 diabetes mellitus     diet controlled  . Carotid artery stenosis     carotid duplex 04-15-2012  . Diabetes mellitus, type 2   . Diverticulosis of colon (without mention of hemorrhage)   . Glaucoma      PSH: Past Surgical History  Procedure Laterality Date  . Coronary artery bypass graft    . Pacemaker insertion    . Back surgery      x 3  . Tonsillectomy and adenoidectomy    . Appendectomy     I have reviewed the FH and SH and  If appropriate update it with new information. Allergies  Allergen Reactions  . Codeine     Unknown   . Statins Other (See Comments)    Weakness in legs, joint pain   . Levothyroxine Anxiety  .  Metformin And Related Rash   Scheduled Meds: . amLODipine  5 mg Oral Daily  . aspirin  325 mg Oral Daily  . feeding supplement (RESOURCE BREEZE)  1 Container Oral TID BM  . heparin  5,000 Units Subcutaneous 3 times per day  . latanoprost  1 drop Both Eyes QHS  . lisinopril  20 mg Oral Daily  . multivitamin with minerals  1 tablet Oral Daily  . pantoprazole  40 mg Oral Daily  . propranolol  10 mg Oral BID   Continuous Infusions: . dextrose 5 % and 0.9% NaCl 75 mL/hr (08/21/13 0345)   PRN Meds:.acetaminophen, calcium carbonate, ipratropium-albuterol, nitroGLYCERIN, ondansetron (ZOFRAN) IV, ondansetron    BP 113/50  Pulse 80  Temp(Src) 98.7 F (37.1 C) (Oral)  Resp 18  Ht 5\' 3"  (1.6 m)  Wt 49.896 kg (110 lb)  BMI 19.49 kg/m2  SpO2 96%   PPS: 20-30 %   Intake/Output Summary (Last 24 hours) at 08/21/13 1638 Last data filed at 08/21/13 1500  Gross per 24 hour  Intake   1485 ml  Output    200 ml  Net   1285 ml  Physical Exam:  General: minimal distress with  Upper airway wheezing HEENT:  PERRL, EOMI, anciteric, mm dry Chest:   Upper airway wheezing intermittent, some basilar crackles CVS: regular rate and rhythm, S1, S2 Abdomen:protuberant, positive bowel sounds, ascites , not tender Ext: no clubbing , cyanosis or edema Neuro:awake , alert with full capacity for decision making  Labs: CBC    Component Value Date/Time   WBC 5.4 08/20/2013 0632   RBC 3.32* 08/20/2013 0632   HGB 9.2* 08/20/2013 0632   HCT 27.0* 08/20/2013 0632   PLT 261 08/20/2013 0632   MCV 81.3 08/20/2013 0632   MCH 27.7 08/20/2013 0632   MCHC 34.1 08/20/2013 0632   RDW 14.1 08/20/2013 0632   LYMPHSABS 0.6* 08/19/2013 0904   MONOABS 0.8 08/19/2013 0904   EOSABS 0.1 08/19/2013 0904   BASOSABS 0.1 08/19/2013 0904     CMP     Component Value Date/Time   NA 127* 08/21/2013 1209   K 3.4* 08/21/2013 1209   CL 93* 08/21/2013 1209   CO2 21 08/21/2013 1209   GLUCOSE 118* 08/21/2013 1209   BUN 10 08/21/2013 1209   CREATININE  0.71 08/21/2013 1209   CALCIUM 8.0* 08/21/2013 1209   PROT 6.4 08/20/2013 0632   ALBUMIN 2.3* 08/20/2013 0632   AST 26 08/20/2013 0632   ALT 5 08/20/2013 0632   ALKPHOS 67 08/20/2013 0632   BILITOT 0.4 08/20/2013 0632   GFRNONAA 73*  08/21/2013 1209   GFRAA 85* 08/21/2013 1209    Chest Xray Reviewed/Impressions: Persistent scarring and patchy infiltrate right base. No new lung  opacity. Underlying emphysema.  Bowel gas pattern unremarkable. There is cholelithiasis. There is  extensive arterial vascular calcification.   CT scan of the Head Reviewed/Impressions: Findings most consistent with ovarian carcinoma with a large pelvic  mass identified, multiple tumor implants in the peritoneum and a  large volume of ascites.  Gallstones.   Time In Time Out Total Time Spent with Patient Total Overall Time  305 pm 400 pm 65 min 65 min    Greater than 50%  of this time was spent counseling and coordinating care related to the above assessment and plan.   Jerusalem Wert L. [name], MD MBA The Palliative Medicine Team at [facility] Phone: [phone] Pager: [phone]

## 2013-08-21 NOTE — Progress Notes (Signed)
INITIAL NUTRITION ASSESSMENT  DOCUMENTATION CODES Per approved criteria  -Severe malnutrition in the context of chronic illness   Patient meets the criteria for severe MALNUTRITION in the context of chronic illness with PO intake <75% of estimated needs for >1 month and severe muscle and subcutaneous fat loss.   INTERVENTION: - Resource Breeze TID between meals, each provides 250 kcal, 9 g protein - Patient encouraged to order meals per preference.   NUTRITION DIAGNOSIS: Inadequate oral intake related to decreased appetite as evidenced by <50% meal intake.   Goal: Patient will meet >/=90% of estimated nutrition needs  Monitor:  PO intake, weight, labs  Reason for Assessment: Consult  80 y.o. female  Admitting Dx: Failure to thrive in adult  ASSESSMENT: 80 y.o. female with past medical history of hypertension, admitted withprogressive poor oral intake, worsening abdominal distention, weakness and fatigue. CT scan findings suggestive of ovarian carcinoma, ovarian mass and large volume ascites.   Patient reports that she was living independently until about 3 weeks ago. She reports that she has not had much of an appetite, feeling full quickly. She has had abdominal distention secondary to ascites. She is s/p paracentesis 3/8, with removal of 1.8 L blood tinged fluid. She is on Megace for appetite stimulation. She is open to trying nutrition supplement, but is reluctant to try Boost or Ensure due to lactose intolerance.   Family to meet with palliative care today at 3pm for [place] meeting.   Nutrition Focused Physical Exam:  Subcutaneous Fat:  Orbital Region: severe wasting Upper Arm Region: severe wasting Thoracic and Lumbar Region: severe wasting  Muscle:  Temple Region: severe wasting Clavicle Bone Region: severe wasting Clavicle and Acromion Bone Region: severe wasting Scapular Bone Region: severe wasting Dorsal Hand: severe wasting Patellar Region: severe  wasting Anterior Thigh Region: severe wasting Posterior Calf Region: severe wasting  Edema: WNL   Height: Ht Readings from Last 1 Encounters:  08/19/13 5\' 3"  (1.6 m)    Weight: Wt Readings from Last 1 Encounters:  08/19/13 110 lb (49.896 kg)  Weight likely down today due to paracentesis.   Ideal Body Weight: 115 pounds  % Ideal Body Weight: 94%  Wt Readings from Last 10 Encounters:  08/19/13 110 lb (49.896 kg)    Usual Body Weight: Unknown  % Usual Body Weight:   BMI:  Body mass index is 19.49 kg/(m^2). Patient is normal weight  Estimated Nutritional Needs: Kcal: 1150-1250 kcal Protein: 60-70 g Fluid: >1.5 L/day  Skin: Intact  Diet Order: General  EDUCATION NEEDS: -No education needs identified at this time   Intake/Output Summary (Last 24 hours) at 08/21/13 1426 Last data filed at 08/21/13 0600  Gross per 24 hour  Intake   1365 ml  Output    200 ml  Net   1165 ml    Last BM: 3/8   Labs:   Recent Labs Lab 08/21/13 0010 08/21/13 0604 08/21/13 1209  NA 124* 127* 127*  K 3.5* 3.7 3.4*  CL 91* 95* 93*  CO2 21 19 21   BUN 11 10 10   CREATININE 0.71 0.67 0.71  CALCIUM 8.1* 8.0* 8.0*  GLUCOSE 114* 116* 118*    CBG (last 3)  No results found for this basename: GLUCAP,  in the last 72 hours  Scheduled Meds: . amLODipine  5 mg Oral Daily  . aspirin  325 mg Oral Daily  . heparin  5,000 Units Subcutaneous 3 times per day  . latanoprost  1 drop Both Eyes QHS  .  lisinopril  20 mg Oral Daily  . megestrol  200 mg Oral BID  . multivitamin with minerals  1 tablet Oral Daily  . propranolol  10 mg Oral BID    Continuous Infusions: . dextrose 5 % and 0.9% NaCl 75 mL/hr (08/21/13 0345)    Past Medical History  Diagnosis Date  . Chest pain 09-25-2009    stress test ,post stress test EF is 91 %, global left  ventricular systolic function is normal, EKG is neg. for ishemia. this is considered a low risk scan.  . S/P CABG x 5 2007    known cardiac  disease  . SSS (sick sinus syndrome)   . Pacemaker 2007    dual chamber [facility] placed for sinus node dysfunction  . AF (atrial fibrillation)   . H/O cardiac catheterization 2007    permanent pacemaker upgrade with atrial lead placement and pulse generator replacement of grade DDD mode.  Marland Kitchen CAD (coronary artery disease)   . Aortic aneurysm   . Hyperlipidemia   . Type 2 diabetes mellitus     diet controlled  . Carotid artery stenosis     carotid duplex 04-15-2012  . Diabetes mellitus, type 2   . Diverticulosis of colon (without mention of hemorrhage)   . Glaucoma     Past Surgical History  Procedure Laterality Date  . Coronary artery bypass graft    . Pacemaker insertion    . Back surgery      x 3  . Tonsillectomy and adenoidectomy    . Appendectomy      Larey Seat, RD, LDN Pager #: [phone] After-Hours Pager #: [phone]

## 2013-08-21 NOTE — Procedures (Signed)
Successful US guided paracentesis from LUQ.  Yielded 1.8 liters of blood tinged fluid.  No immediate complications.  Pt tolerated well.   Specimen was sent for labs.  Tsosie Billing D PA-C 08/21/2013 11:21 AM

## 2013-08-21 NOTE — Progress Notes (Signed)
Per RN, Pt converted to inpt status yesterday at 1427.  Met with Pt and her family.  Provided bed offers.  Family hoping for more offers and asked that CSW do another search on Pt's behalf, in light of her new status and the fact that private pay is no longer a consideration.  CSW answered all questions and will do another bed search on Pt's behalf.  CSW thanked Pt and her family for their time.  Bernita Raisin, [place] Work [phone]

## 2013-08-21 NOTE — Progress Notes (Signed)
[facility] PROGRESS NOTE  Lisa Marquez YWV:[id number] DOB: 1920-03-10 DOA: 08/19/2013 PCP: Horatio Pel, MD  Brief narrative: 80 y.o. female with past medical history of hypertension, glaucoma who presented to [facility] ED 08/19/2013 with progressive poor oral intake, worsening abdominal distention, weakness and fatigue. Her family confirms the same but pt mental status is very good and she is able to provide details of her medical history.  She was found to have hyponatremia on admission with sodium of 121 and more concerning CT scan findings suggestive of ovarian carcinoma, ovarian mass and large volume ascites. She confirms DNR code status. She says she would not be ready for chemo if indeed this is cancer. Family to meet with PCT for [place] meeting today.   Assessment/Plan:   Active Problems:  Failure to thrive in adult, generalized weakness, dehydration, severe protein calorie malnutrition in the setting of probable new ovarian cancer  - secondary to likely ovarian cancer, ascites  - appreciate PCT meeting with the famiy to discuss the goals of care - encourage PO intake  - continue megace and d/c Marinol as it made her mouth very dry Hyponatremia  - secondary to dehydration or SIADH from malignancy  - continue IV fluids for now  - sodium improving with IV fluids  Ovarian cancer, large volume ascites  - plan for PCT to meet with the family for goals of care  - status post ascites with 1.8 L fluid drained and fluid sent for analysis Anemia of chronic disease  - likely due to poor nutrition and ovarian ca  - hemoglobin 9.2  - no indications for transfusion   Code Status: DNR/DNI  Family Communication: family at the bedside  Disposition Plan: home when stable   Consultants:  IR  Palliative care Procedures:  Paracentesis 08/20/2013 with 1.8 L fluid drained Antibiotics:  Azithromycin needs to complete 2 more day, last day 08/21/13   Leisa Lenz, MD  [facility] Pager [phone]  If 7PM-7AM, please contact night-coverage [email or web address] Password [email or web address] 08/21/2013, 2:05 PM   LOS: 2 days    HPI/Subjective: No acute overnight events.   Objective: Filed Vitals:   08/21/13 0911 08/21/13 0939 08/21/13 0948 08/21/13 1114  BP: 131/53 130/47 125/44 114/47  Pulse:      Temp:      TempSrc:      Resp:      Height:      Weight:      SpO2:        Intake/Output Summary (Last 24 hours) at 08/21/13 1405 Last data filed at 08/21/13 0600  Gross per 24 hour  Intake   1365 ml  Output    200 ml  Net   1165 ml    Exam:   General:  Pt is alert, follows commands appropriately, not in acute distress  Cardiovascular: Regular rate and rhythm, S1/S2 appreciated  Respiratory: Clear to auscultation bilaterally, no wheezing, no crackles, no rhonchi  Abdomen: Soft, non tender, less distended, bowel sounds present, no guarding  Extremities: No edema, pulses DP and PT palpable bilaterally  Neuro: Grossly nonfocal  Data Reviewed: Basic Metabolic Panel:  Recent Labs Lab 08/20/13 1221 08/20/13 1818 08/21/13 0010 08/21/13 0604 08/21/13 1209  NA 126* 125* 124* 127* 127*  K 3.8 3.6* 3.5* 3.7 3.4*  CL 91* 91* 91* 95* 93*  CO2 20 21 21 19 21   GLUCOSE 143* 146* 114* 116* 118*  BUN 12 12 11 10 10   CREATININE 0.81 0.75 0.71 0.67 0.71  CALCIUM  8.4 8.6 8.1* 8.0* 8.0*   Liver Function Tests:  Recent Labs Lab 08/19/13 0904 08/20/13 0632  AST 31 26  ALT 8 5  ALKPHOS 72 67  BILITOT 0.4 0.4  PROT 6.9 6.4  ALBUMIN 2.7* 2.3*    Recent Labs Lab 08/19/13 0904  LIPASE 36   No results found for this basename: AMMONIA,  in the last 168 hours CBC:  Recent Labs Lab 08/19/13 0904 08/20/13 0632  WBC 5.8 5.4  NEUTROABS 4.2  --   HGB 9.8* 9.2*  HCT 29.0* 27.0*  MCV 81.7 81.3  PLT 272 261   Cardiac Enzymes: No results found for this basename: CKTOTAL, CKMB, CKMBINDEX, TROPONINI,  in the last 168 hours BNP: No components found with  this basename: POCBNP,  CBG: No results found for this basename: GLUCAP,  in the last 168 hours  No results found for this or any previous visit (from the past 240 hour(s)).   Studies: No results found.  Scheduled Meds: . amLODipine  5 mg Oral Daily  . aspirin  325 mg Oral Daily  . heparin  5,000 Units Subcutaneous 3 times per day  . latanoprost  1 drop Both Eyes QHS  . lisinopril  20 mg Oral Daily  . megestrol  200 mg Oral BID  . multivitamin with minerals  1 tablet Oral Daily  . propranolol  10 mg Oral BID   Continuous Infusions: . dextrose 5 % and 0.9% NaCl 75 mL/hr (08/21/13 0345)

## 2013-08-22 ENCOUNTER — Inpatient Hospital Stay (HOSPITAL_COMMUNITY): Payer: Medicare Other

## 2013-08-22 DIAGNOSIS — E43 Unspecified severe protein-calorie malnutrition: Secondary | ICD-10-CM

## 2013-08-22 DIAGNOSIS — K219 Gastro-esophageal reflux disease without esophagitis: Secondary | ICD-10-CM

## 2013-08-22 DIAGNOSIS — Z515 Encounter for palliative care: Secondary | ICD-10-CM

## 2013-08-22 DIAGNOSIS — R062 Wheezing: Secondary | ICD-10-CM

## 2013-08-22 MED ORDER — PANTOPRAZOLE SODIUM 40 MG PO TBEC
40.0000 mg | DELAYED_RELEASE_TABLET | Freq: Two times a day (BID) | ORAL | Status: DC
  Administered 2013-08-22 – 2013-08-23 (×2): 40 mg via ORAL
  Filled 2013-08-22 (×3): qty 1

## 2013-08-22 MED ORDER — IPRATROPIUM-ALBUTEROL 0.5-2.5 (3) MG/3ML IN SOLN
3.0000 mL | Freq: Four times a day (QID) | RESPIRATORY_TRACT | Status: DC
  Administered 2013-08-23 (×2): 3 mL via RESPIRATORY_TRACT
  Filled 2013-08-22 (×2): qty 3

## 2013-08-22 MED ORDER — BRIMONIDINE TARTRATE 0.1 % OP SOLN
1.0000 [drp] | Freq: Every day | OPHTHALMIC | Status: DC
  Administered 2013-08-22 – 2013-08-23 (×2): 1 [drp] via OPHTHALMIC

## 2013-08-22 MED ORDER — BRIMONIDINE TARTRATE 0.15 % OP SOLN
1.0000 [drp] | Freq: Every morning | OPHTHALMIC | Status: DC
Start: 2013-08-22 — End: 2013-08-22

## 2013-08-22 MED ORDER — IPRATROPIUM-ALBUTEROL 0.5-2.5 (3) MG/3ML IN SOLN
3.0000 mL | RESPIRATORY_TRACT | Status: DC
  Administered 2013-08-22 (×3): 3 mL via RESPIRATORY_TRACT
  Filled 2013-08-22 (×4): qty 3

## 2013-08-22 MED ORDER — IPRATROPIUM-ALBUTEROL 0.5-2.5 (3) MG/3ML IN SOLN: 3.0000 mL | RESPIRATORY_TRACT | Status: DC | PRN

## 2013-08-22 NOTE — Progress Notes (Signed)
CSW continuing to follow for disposition planning.  CSW received consult from Dr. [name], PMT MD requesting CSW contacted Dr. [name] this am. CSW contacted Dr. [name] via telephone. Per Dr. [name], PMT [place] held on 08/21/2013 and Dr. [name] requested CSW to offer pt family residential hospice choice and Dr. [name] plans to contact residential hospice facility of pt choice in order to present pt case to hospice facility for placement.   CSW met with pt, pt two sons and daughter-in-law at bedside. CSW discussed conversation with Dr. [name] and offered residential hospice choice. Pt family choose [facility] and if [facility] is unable to accept pt then pt family would be agreeable to [facility]. CSW provided support and clarified pt and pt families questions surrounding residential hospice placement.  CSW discussed with pt and pt family that SNF would be secondary option if residential hospice facilities do not feel that pt meets criteria at this time. Pt family expressed understanding and CSW discussed with pt family about palliative care services at SNF.   Pt family is hopeful that pt will be appropriate for residential hospice as they have noticed a decline in pt in just the past few days. Pt is agreeable to what pt sons decide. CSW allowed time for pt to reflect on her feelings and share stories about raising her two sons.  CSW notified Dr. [name] of pt family first choice being [facility] and second choice of [facility] if [facility] unable to accept.   CSW provided updated SNF bed offers in order to have secondary option if pt does not qualify for residential hospice.  CSW to continue to follow to provide support and assist with pt disposition needs.   Alison Murray, MSW, [place] Work [phone]

## 2013-08-22 NOTE — Progress Notes (Signed)
CSW continuing to follow for disposition planning.  CSW spoke with Erling Conte, [facility] liaison who discussed that pt referral is still being processed to determine if pt appropriate for residential hospice, but even if pt deemed appropriate there is not bed availability today 3/9 or tomorrow 3/10 at this time.  CSW made referral to [place] and awaiting response if pt appropriate for residential hospice and information regarding bed availability.  CSW visited pt room and not family present at bedside. Pt resting soundly at this time.  CSW contacted pt son, Jillene Wehrenberg via telephone and updated pt son about residential hospice referrals and updated SNF bed offers left at bedside and pt son notified.  CSW to follow up with pt and pt family tomorrow to continue to assist with disposition planning.  Alison Murray, MSW, [place] Work [phone]

## 2013-08-22 NOTE — Progress Notes (Addendum)
[facility] PROGRESS NOTE  Lisa Marquez TDV:[id number] DOB: 04-26-1920 DOA: 08/19/2013 PCP: Lisa Pel, MD  Brief narrative: 80 y.o. female with past medical history of hypertension, glaucoma who presented to [facility] ED 08/19/2013 with progressive poor oral intake, worsening abdominal distention, weakness and fatigue. Her family confirms the same but pt mental status is very good and she is able to provide details of her medical history.  She was found to have hyponatremia on admission with sodium of 121 and more concerning CT scan findings suggestive of ovarian carcinoma, ovarian mass and large volume ascites. She confirms DNR code status. Family met with palliative care team.  Assessment/Plan:   Active Problems:  Failure to thrive in adult, generalized weakness, dehydration, severe protein calorie malnutrition in the setting of probable new ovarian cancer  - secondary to likely ovarian cancer, ascites  - We'll likely go to residential hospice versus home with hospice when stable for discharge - encourage PO intake  - Discontinue the Marinol as it made her mouth very dry. Discontinued Megace due to potential side effects of blood clots. Hyponatremia  - secondary to dehydration or SIADH from malignancy  - continue IV fluids for now  - sodium improving with IV fluids  Ovarian cancer, large volume ascites  - Focus on symptom management - status post ascites with 1.8 L fluid drained and fluid sent for analysis  Anemia of chronic disease  - likely due to poor nutrition and ovarian ca  - hemoglobin 9.2  - no indications for transfusion   Code Status: DNR/DNI  Family Communication: family at the bedside  Disposition Plan: remains inpatient   Consultants:  IR  Palliative care Procedures:  Paracentesis 08/20/2013 with 1.8 L fluid drained Antibiotics:  Azithromycin needs to complete 2 more day, last day 08/21/13   Lisa Lenz, MD  [facility] Pager [phone]  If  7PM-7AM, please contact night-coverage [email or web address] Password [email or web address] 08/22/2013, 3:00 PM   LOS: 3 days    HPI/Subjective: Had rough night last night, shortness of breath and wheezing and did not sleep much.   Objective: Filed Vitals:   08/21/13 1410 08/21/13 2125 08/22/13 0518 08/22/13 1438  BP: 113/50 139/62 129/52 112/44  Pulse: 80 87 91 77  Temp: 98.7 F (37.1 C) 97.8 F (36.6 C) 98.3 F (36.8 C) 97.1 F (36.2 C)  TempSrc: Oral Oral Oral Oral  Resp: 18 24 24 20   Height:      Weight:      SpO2: 96% 95% 94% 96%    Intake/Output Summary (Last 24 hours) at 08/22/13 1500 Last data filed at 08/22/13 1300  Gross per 24 hour  Intake   1920 ml  Output    500 ml  Net   1420 ml    Exam:   General:  Pt is alert, follows commands appropriately, not in acute distress  Cardiovascular: Regular rate and rhythm, S1/S2, no murmurs, no rubs, no gallops  Respiratory: wheezing in upper lung lobes appreciated, no rhonchi  Abdomen: Soft, distended, bowel sounds present, no guarding  Extremities: No edema, pulses DP and PT palpable bilaterally  Neuro: Grossly nonfocal  Data Reviewed: Basic Metabolic Panel:  Recent Labs Lab 08/20/13 1221 08/20/13 1818 08/21/13 0010 08/21/13 0604 08/21/13 1209  NA 126* 125* 124* 127* 127*  K 3.8 3.6* 3.5* 3.7 3.4*  CL 91* 91* 91* 95* 93*  CO2 20 21 21 19 21   GLUCOSE 143* 146* 114* 116* 118*  BUN 12 12 11 10 10   CREATININE 0.81  0.75 0.71 0.67 0.71  CALCIUM 8.4 8.6 8.1* 8.0* 8.0*   Liver Function Tests:  Recent Labs Lab 08/19/13 0904 08/20/13 0632  AST 31 26  ALT 8 5  ALKPHOS 72 67  BILITOT 0.4 0.4  PROT 6.9 6.4  ALBUMIN 2.7* 2.3*    Recent Labs Lab 08/19/13 0904  LIPASE 36   No results found for this basename: AMMONIA,  in the last 168 hours CBC:  Recent Labs Lab 08/19/13 0904 08/20/13 0632  WBC 5.8 5.4  NEUTROABS 4.2  --   HGB 9.8* 9.2*  HCT 29.0* 27.0*  MCV 81.7 81.3  PLT 272 261   Cardiac Enzymes: No  results found for this basename: CKTOTAL, CKMB, CKMBINDEX, TROPONINI,  in the last 168 hours BNP: No components found with this basename: POCBNP,  CBG: No results found for this basename: GLUCAP,  in the last 168 hours  Recent Results (from the past 240 hour(s))  BODY FLUID CULTURE     Status: None   Collection Time    08/21/13  9:34 AM      Result Value Ref Range Status   Specimen Description ASCITIC   Final   Special Requests NONE   Final   Gram Stain     Final   Value: WBC PRESENT, PREDOMINANTLY MONONUCLEAR     NO ORGANISMS SEEN     Performed at [facility]   Culture     Final   Value: NO GROWTH 1 DAY     Performed at [facility]   Report Status PENDING   Incomplete     Studies: No results found.  Scheduled Meds: . amLODipine  5 mg Oral Daily  . aspirin  325 mg Oral Daily  . brimonidine  1 drop Right Eye Daily  . feeding supplement (RESOURCE BREEZE)  1 Container Oral TID BM  . heparin  5,000 Units Subcutaneous 3 times per day  . ipratropium-albuterol  3 mL Nebulization Q4H  . latanoprost  1 drop Both Eyes QHS  . lisinopril  20 mg Oral Daily  . multivitamin with minerals  1 tablet Oral Daily  . pantoprazole  40 mg Oral BID  . propranolol  10 mg Oral BID   Continuous Infusions: . dextrose 5 % and 0.9% NaCl 10 mL/hr at 08/22/13 1340

## 2013-08-22 NOTE — Progress Notes (Signed)
Patient has begun to decline BD treatments. However, she ultimately calls for prn after declining the scheduled dose. She does not wish to be awakened several times throughout the night if her breathing is not labored. RT protocol assessment completed. Orders changed accordingly and patient agrees to Q6 schedule. However, she continues her request not to be awakened unless need be. She also is aware that she may call for RT if she feels the need.

## 2013-08-22 NOTE — Progress Notes (Addendum)
CSW received phone call from [facility], Erling Conte.  Per [facility], Erling Conte pt appropriate for [facility] and pt will definitely have bed available on Wednesday 3/11. [place] liaison, Erling Conte plans to complete paperwork with pt family this evening in order for paperwork to be completed in case bed availability changes for tomorrow 3/10.   CSW notified pt son, Abigayle Wilinski via telephone. Pt son expressed extreme relief when provided news and planned to arrive to hospital by 5:30 pm in order to complete paperwork with [facility], Erling Conte.  CSW discussed with pt at bedside. Pt granddaughter present at this time.   CSW updated PMT MD, Dr. [name] via voice message via telephone.   CSW to continue to follow to assist with pt disposition planning.  Alison Murray, MSW, [place] Work [phone]

## 2013-08-22 NOTE — Consult Note (Signed)
[place] Liaison: Received request from [place] for patient/family interest in [facility]. Chart reviewed and received report from Dr. [name]. [place] room definitely available for patient Wednesday 08/24/2013 and possibly available Tuesday 08/23/2013. Will let CSW know in am. Patient completed registration paperwork this evening and requested Dr. Orpah Melter to assume care at [facility]. Will need discharge summary faxed to [phone] and RN to call report to [phone] on day of transfer. Thank you. Erling Conte LCSW [phone]

## 2013-08-22 NOTE — Progress Notes (Signed)
Patient ID:Lisa Marquez      DOB: 10/19/1919      XTK:[id number]   Palliative Medicine Team at [facility] Progress Note    Subjective: Lisa Marquez said she had a hard night due to wheezing.  She has intermittent right leg pain which only happens at night, not present now.  She is getting weaker per her daughter in law who has been helping her to the comode. She can barely stand.  Filed Vitals:   08/22/13 0518  BP: 129/52  Pulse: 91  Temp: 98.3 F (36.8 C)  Resp: 24   Physical exam:  General:  No acute distress  PERRL, EOMI,  Clarity of mind is present Chest decreased with intermittent wheezing CVS: regular , S1 S2 Abd: distended and firm, not tender with positive bowel sounds Ext: warm, no mottling Neuro: awake , alert and oriented   Path pending Assessment and plan: 80 yr old with finding of mass likely ovarian in origin, ascities, with anorexia and faliure to thrive resulting in severe malnutrition Patient does not desire any intervention.  She states she would "just like to go to sleep"  1.  DNR  2.  Wheezing with respiratory distress- suspect reflux with aspiration and penumonitis.  Increase protonix to bid. Schedule nebs which help a lot.  Decrease IVF to [facility] could easily have volume overload. Xray pending.  3.  Failure to thrive with malnutrition. Comfort feed what ever she desires to eat  4.  Transition to hospice facility when bed available.  I suspect a rapid decline in status.  Prognosis: days to week  Total time :  20 min  Lisa Appleton L. [name], MD MBA The Palliative Medicine Team at [facility] Phone: [phone] Pager: [phone]   Addendum: increasing pleural effusion, and some vascular congestion .  Consider single dose lasix if worsens, KVO or Discontinue IVF.  Lisa Tonkinson L. [name], MD MBA The Palliative Medicine Team at [facility] Phone: [phone] Pager: [phone]

## 2013-08-23 MED ORDER — MORPHINE SULFATE 2 MG/ML IJ SOLN
2.0000 mg | Freq: Once | INTRAMUSCULAR | Status: AC
  Administered 2013-08-23: 2 mg via INTRAVENOUS
  Filled 2013-08-23: qty 1

## 2013-08-23 MED ORDER — MORPHINE SULFATE 2 MG/ML IJ SOLN: 2.0000 mg | INTRAMUSCULAR | Status: DC | PRN

## 2013-08-23 MED ORDER — BOOST / RESOURCE BREEZE PO LIQD: 1.0000 | Freq: Three times a day (TID) | ORAL | Status: AC

## 2013-08-23 MED ORDER — MORPHINE SULFATE (CONCENTRATE) 10 MG /0.5 ML PO SOLN: 10.0000 mg | ORAL | Status: AC | PRN

## 2013-08-23 MED ORDER — IPRATROPIUM-ALBUTEROL 0.5-2.5 (3) MG/3ML IN SOLN: 3.0000 mL | Freq: Four times a day (QID) | RESPIRATORY_TRACT | Status: AC

## 2013-08-23 MED ORDER — ONDANSETRON HCL 4 MG PO TABS: 4.0000 mg | ORAL_TABLET | Freq: Four times a day (QID) | ORAL | Status: AC | PRN

## 2013-08-23 MED ORDER — PANTOPRAZOLE SODIUM 40 MG PO TBEC: 40.0000 mg | DELAYED_RELEASE_TABLET | Freq: Two times a day (BID) | ORAL | Status: AC

## 2013-08-23 NOTE — Progress Notes (Signed)
Report called to Teresa at [facility]. 

## 2013-08-23 NOTE — Progress Notes (Signed)
CSW received notification from [facility], Erling Conte that bed available for Lisa Marquez today at [facility].  CSW visited Lisa Marquez and Lisa Marquez family at bedside to notify. Lisa Marquez and Lisa Marquez family pleased to hear that Lisa Marquez will be able to transition to [facility] today and agreeable to transfer to [facility].  CSW notified MD.  CSW facilitated Lisa Marquez discharge needs including faxing Lisa Marquez discharge information to [facility], discussing with Lisa Marquez and Lisa Marquez family at bedside, providing RN phone number to call report, and arranging ambulance transport for Lisa Marquez to [facility].   No further social work needs identified at this time.  CSW signing off.   Alison Murray, MSW, [place] Work [phone]

## 2013-08-23 NOTE — Discharge Instructions (Signed)
Hospice Care  °Hospice is a care service which can be used by people who are terminally ill and in whom healing is no longer thought possible. Hospice care is for people believed to have no more than 6 months to live. It is meant to help with the two largest fears near the end of life (the fears of dying and of being alone), as well as pain management, and an attempt to allow people to pass away comfortably at home.  °Hospice staff: °· Administer appropriate pain relief. °· Provide nursing care. °· Offer reassurance and support to loved ones and family members. °· Provide services to keep people comfortable at home or in a hospice facility. °Together, you can see to it that your loved one is not alone during this last and important phase of life. °You, your family, and your caregivers help you decide when hospice services should begin. If your condition improves or the disease goes into remission, you can be discharged from the hospice program. You can return to hospice care at a later time if needed. °The hospice philosophy recognizes death as the final stage of life. It helps patients continue an alert, pain-free life, and manage symptoms while surrounded by their loved ones. Hospice affirms life without hurrying death. Hospice care treats the person rather than the disease. It emphasizes quality of life with family-centered care. Hospice care involves the patient and family and helps them make decisions.  °The care is designed to: °· Relieve or decrease pain. °· Control other problems. °· Provide as much quality time as possible. °· Allow people to die with dignity. °Unlike other medical care, the focus is no longer on curing disease. The goal of hospice care is to offer as high a quality of life as possible during the end of life. In this way, the last days of life may be spent with dignity.  °With hospice care, instead of spending the last weeks or months in a hospital, a person is with loved ones in the home  or a homelike setting. About 90 percent of hospice care is provided at home. But hospice is available wherever a person lives, including a nursing home or assisted-living residence. Some residential hospices designed specifically for hospice care also exist. Hospice care is available for many types of terminal illnesses. °Hospice services are meant to serve both the patient and family members. °· Comfort. In most cases, the individual stays in his or her home or in homelike surroundings instead of in a hospital. The core of hospice is a cooperative effort by family, friends and a team of professional and volunteer caregivers working together to meet your loved one's needs. This team supplies all necessary medicines and equipment. It works with both the person involved and family members to relieve pain and symptoms. °· Support. Individuals enjoy the support of loved ones by receiving much of the basic care from family and friends. A nurse may lead the team and coordinates the day-to-day care. A doctor is also part of the team. Chaplains and social workers are available to counsel the family and their loved one. They make sure emotional, spiritual, and social needs are being met. Trained volunteers perform a wide variety of tasks as needed, such as: °· Providing companionship. °· Doing light housekeeping. °· Preparing meals. °· Running errands. °· Providing respite for the family. °· Improving quality of life. Caring for someone who is dying is emotionally and physically demanding. This can be particularly true for family members   who are primary caregivers. But you can take comfort in knowing that hospice is an act of love that can improve the quality of life for all involved. Professionals are often available to tend to the needs of grieving family members as well.  Spiritual Care. Hospice care emphasizes the spiritual needs of you and your family. People differ in their spiritual needs and religious beliefs so  spiritual care is individualized to meet the persons' and family's needs. It may include helping you to look at what death means to you, to say good-bye, or to perform a specific religious ceremony or ritual. HOW TO SELECT A PROGRAM Most hospice programs are run by nonprofit, independent organizations. Some are affiliated with hospitals, nursing homes or home health care agencies. Some are for-profit organizations. You can learn about existing hospice programs in your area from your health caregivers. ASK THE FOLLOWING:  What services are available to the patient?  What services are offered to the family?  Are bereavement services available?  How involved are the family members?  How involved is the doctor?  Who makes up the hospice care team? How are they trained or screened?  How will the individual's pain and symptoms be managed?  If circumstances change, can services be provided in different settings, such as the home or the hospital?  Is the program reviewed and licensed by the state or certified in some other way?  Are all costs covered by insurance? How much you pay for hospice care can vary greatly. It depends on the length and type of care necessary and your insurance coverage. Medicare and most private insurance plans, including managed care organizations, cover hospice care. Hospice is also covered by Medicaid in most states. Some hospice programs provide services on a sliding fee scale, based on your ability to pay. They may also provide some durable medical equipment for support within the home. Document Released: 09/19/2003 Document Revised: 08/25/2011 Document Reviewed: 06/02/2005 [facility] Patient Information 2014 [place], [place].

## 2013-08-23 NOTE — Discharge Summary (Signed)
Physician Discharge Summary  Lisa Marquez TJQ:[id number] DOB: [birth year] DOA: 08/19/2013  PCP: Horatio Pel, MD  Admit date: 08/19/2013 Discharge date: 08/23/2013  Recommendations for Outpatient Follow-up:  discharge to residential hospice    Discharge Diagnoses:  Active Problems:   Failure to thrive in adult   Hyponatremia   Ovarian cancer   Protein-calorie malnutrition, severe    Discharge Condition: medically stable for discharge to residential hospice today  Diet recommendation: as tolerated   History of present illness:  80 y.o. female with past medical history of hypertension, glaucoma who presented to [facility] ED 08/19/2013 with progressive poor oral intake, worsening abdominal distention, weakness and fatigue. Her family confirms the same but pt mental status is very good and she is able to provide details of her medical history.  She was found to have hyponatremia on admission with sodium of 121 and more concerning CT scan findings suggestive of ovarian carcinoma, ovarian mass and large volume ascites. She confirms DNR code status. Family met with palliative care team.   Assessment/Plan:   Active Problems:  Failure to thrive in adult, generalized weakness, dehydration, severe protein calorie malnutrition in the setting of probable new ovarian cancer  - secondary to ovarian cancer, ascites  - transition to residential hospice today  - encourage PO intake  Hyponatremia  - secondary to dehydration or SIADH from malignancy - sodium improved with IV fluids  Ovarian cancer, large volume ascites  - Focus on symptom management  - status post ascites with 1.8 L fluid drained and fluid sent for analysis  Anemia of chronic disease  - likely due to poor nutrition and ovarian ca  - hemoglobin 9.2  - no indications for transfusion   Code Status: DNR/DNI  Family Communication: family at the bedside   Consultants:  IR  Palliative care Procedures:  Paracentesis 08/20/2013  with 1.8 L fluid drained Antibiotics:  Azithromycin needs to complete 2 more day, last day 08/21/13    Signed:  Leisa Lenz, MD  [facility] 08/23/2013, 9:31 AM  Pager #: [phone]  Discharge Exam: Filed Vitals:   08/23/13 0539  BP: 102/49  Pulse: 85  Temp: 97.7 F (36.5 C)  Resp: 22   Filed Vitals:   08/22/13 2119 08/22/13 2127 08/23/13 0539 08/23/13 0738  BP: 115/53  102/49   Pulse: 73  85   Temp: 97.4 F (36.3 C)  97.7 F (36.5 C)   TempSrc: Oral  Oral   Resp: 18  22   Height:      Weight:      SpO2: 97% 96% 95% 93%   Exam:  General: Pt is alert, follows commands appropriately, not in acute distress  Cardiovascular: Regular rate and rhythm, S1/S2, no murmurs, no rubs, no gallops  Respiratory: wheezing in upper lung lobes appreciated, no rhonchi  Abdomen: Soft, distended, bowel sounds present, no guarding  Extremities: No edema, pulses DP and PT palpable bilaterally  Neuro: Grossly nonfocal   Discharge Instructions  Discharge Orders   Future Appointments Provider Department Dept Phone   09/30/2013 10:30 AM Sanda Klein, MD [facility] [phone]   Future Orders Complete By Expires   Call MD for:  difficulty breathing, headache or visual disturbances  As directed    Call MD for:  persistant dizziness or light-headedness  As directed    Call MD for:  persistant nausea and vomiting  As directed    Call MD for:  severe uncontrolled pain  As directed    Diet -  low sodium heart healthy  As directed    Discharge instructions  As directed    Comments:     1. Please note we stopped Norvasc and lisinopril due to borderline low blood pressure   Increase activity slowly  As directed        Medication List    STOP taking these medications       amLODipine 10 MG tablet  Commonly known as:  NORVASC     lisinopril 20 MG tablet  Commonly known as:  PRINIVIL,ZESTRIL     nitroGLYCERIN 0.4 MG SL tablet  Commonly known as:  NITROSTAT      polyethylene glycol packet  Commonly known as:  MIRALAX / GLYCOLAX     ZITHROMAX Z-PAK 250 MG tablet  Generic drug:  azithromycin      TAKE these medications       aspirin 325 MG tablet  Take 325 mg by mouth daily.     BENEFIBER DRINK MIX Pack  Take 1 packet by mouth daily after breakfast.     brimonidine 0.1 % Soln  Commonly known as:  ALPHAGAN P  Place 1 drop into the right eye daily.     feeding supplement (RESOURCE BREEZE) Liqd  Take 1 Container by mouth 3 (three) times daily between meals.     ipratropium-albuterol 0.5-2.5 (3) MG/3ML Soln  Commonly known as:  DUONEB  Take 3 mLs by nebulization every 6 (six) hours.     latanoprost 0.005 % ophthalmic solution  Commonly known as:  XALATAN  Place 1 drop into both eyes at bedtime.     morphine CONCENTRATE 10 mg / 0.5 ml concentrated solution  Take 0.5 mLs (10 mg total) by mouth every 2 (two) hours as needed for severe pain.     ondansetron 4 MG tablet  Commonly known as:  ZOFRAN  Take 1 tablet (4 mg total) by mouth every 6 (six) hours as needed for nausea.     pantoprazole 40 MG tablet  Commonly known as:  PROTONIX  Take 1 tablet (40 mg total) by mouth 2 (two) times daily.     propranolol 20 MG tablet  Commonly known as:  INDERAL  Take 10 mg by mouth 2 (two) times daily.           Follow-up Information   Follow up with Horatio Pel, MD.   Specialty:  Internal Medicine   Contact information:   [address] [place] [address] [phone]        The results of significant diagnostics from this hospitalization (including imaging, microbiology, ancillary and laboratory) are listed below for reference.    Significant Diagnostic Studies: Ct Abdomen Pelvis W Contrast  08/19/2013   CLINICAL DATA:  Decreased appetite over the past few months. Abdominal bloating.  EXAM: CT ABDOMEN AND PELVIS WITH CONTRAST  TECHNIQUE: Multidetector CT imaging of the abdomen and pelvis was performed  using the standard protocol following bolus administration of intravenous contrast.  CONTRAST:  80 mL OMNIPAQUE IOHEXOL 300 MG/ML  SOLN  COMPARISON:  Chest in two views abdomen earlier this same day and two views abdomen 08/15/2013.  FINDINGS: Trace left pleural effusion is present. There is a small to moderate right pleural effusion. There is some dependent atelectasis in the bases.  There is a large volume of abdominal ascites. A large mass is seen in the pelvis measuring 14.5 cm AP x 13.6 cm transverse x 6.5 cm craniocaudal, likely ovarian in origin. Tumor implants are identified  in bilateral inguinal hernias. The lesion in the left inguinal hernia measures 4.6 x 4.5 cm on image 80. Multiple tumor implants are seen along the the capsular surface of the liver. Index lesion on image 25 measures 2.2 cm in diameter.  The liver, spleen, adrenal glands and pancreas are unremarkable. Gallstones are noted. Low attenuating lesions in both kidneys cannot be definitively characterized but are likely cysts. There is mild fullness of the renal collecting systems bilaterally. Extensive aortoiliac atherosclerosis is noted. No lytic or sclerotic bony lesion is identified. The patient is status post L4-5 fusion. There is a compression fracture of L1 which appears remote.  IMPRESSION: Findings most consistent with ovarian carcinoma with a large pelvic mass identified, multiple tumor implants in the peritoneum and a large volume of ascites.  Gallstones.  Critical Value/emergent results were called by telephone at the time of interpretation on 08/19/2013 at 1:26 PM to Dr. Thayer Jew , who verbally acknowledged these results.   Electronically Signed   By: Inge Rise M.D.   On: 08/19/2013 13:28   US Paracentesis  08/23/2013   CLINICAL DATA:  Ovarian cancer, ascites, request for paracentesis.  EXAM: ULTRASOUND GUIDED diagnostic and therapeutic PARACENTESIS  COMPARISON:  None.  PROCEDURE: An ultrasound guided paracentesis  was thoroughly discussed with the patient and questions answered. The benefits, risks, alternatives and complications were also discussed. The patient understands and wishes to proceed with the procedure. Written consent was obtained.  Ultrasound was performed to localize and mark an adequate pocket of fluid in the left upper quadrant of the abdomen. The area was then prepped and draped in the normal sterile fashion. 1% Lidocaine was used for local anesthesia. Under ultrasound guidance a 19 gauge Yueh catheter was introduced. Paracentesis was performed. The catheter was removed and a dressing applied.  Complications: None.  FINDINGS: A total of approximately 1.8 liters of blood tinged fluid was removed. A fluid sample was sent for laboratory analysis.  IMPRESSION: Successful ultrasound guided paracentesis yielding 1.8 liters of ascites.  Read By:  Tsosie Billing PA-C   Electronically Signed   By: Aletta Edouard M.D.   On: 08/22/2013 11:48   Dg Chest Port 1 View  08/22/2013   CLINICAL DATA:  Shortness of breath.  EXAM: PORTABLE CHEST - 1 VIEW  COMPARISON:  08/19/2013 and 08/15/2013  FINDINGS: There is pulmonary vascular congestion and slight interstitial accentuation with increased right and new left pleural effusions. Findings are consistent with pulmonary edema.  Two lead pacemaker in place.  No acute osseous abnormality.  IMPRESSION: Pulmonary vascular congestion with new left and increased right pleural effusions, both small.   Electronically Signed   By: Rozetta Nunnery M.D.   On: 08/22/2013 18:32   Dg Abd Acute W/chest  08/19/2013   CLINICAL DATA:  Abdominal distention and diarrhea  EXAM: ACUTE ABDOMEN SERIES (ABDOMEN 2 VIEW & CHEST 1 VIEW)  COMPARISON:  August 15, 2013  FINDINGS: PA chest: There is scarring in the right base with stable patchy infiltrate in the right base. There is underlying emphysema. Heart size is normal. Pulmonary vascularity reflects underlying emphysema. Pacemaker leads are attached to  the right atrium right ventricle. There is extensive atherosclerotic change in the aorta.  Supine and left lateral decubitus abdomen. The bowel gas pattern is unremarkable. No obstruction or free air. There are multiple gallstones in the gallbladder region. There is extensive atherosclerotic change in the aorta and iliac arteries. There is postoperative change in the lumbar spine. There is advanced  arthropathy in the lumbar spine.  IMPRESSION: Persistent scarring and patchy infiltrate right base. No new lung opacity. Underlying emphysema.  Bowel gas pattern unremarkable. There is cholelithiasis. There is extensive arterial vascular calcification.   Electronically Signed   By: Lowella Grip M.D.   On: 08/19/2013 08:59    Microbiology: Recent Results (from the past 240 hour(s))  BODY FLUID CULTURE     Status: None   Collection Time    08/21/13  9:34 AM      Result Value Ref Range Status   Specimen Description ASCITIC   Final   Special Requests NONE   Final   Gram Stain     Final   Value: WBC PRESENT, PREDOMINANTLY MONONUCLEAR     NO ORGANISMS SEEN     Performed at [facility]   Culture     Final   Value: NO GROWTH 1 DAY     Performed at [facility]   Report Status PENDING   Incomplete     Labs: Basic Metabolic Panel:  Recent Labs Lab 08/20/13 1221 08/20/13 1818 08/21/13 0010 08/21/13 0604 08/21/13 1209  NA 126* 125* 124* 127* 127*  K 3.8 3.6* 3.5* 3.7 3.4*  CL 91* 91* 91* 95* 93*  CO2 20 21 21 19 21   GLUCOSE 143* 146* 114* 116* 118*  BUN 12 12 11 10 10   CREATININE 0.81 0.75 0.71 0.67 0.71  CALCIUM 8.4 8.6 8.1* 8.0* 8.0*   Liver Function Tests:  Recent Labs Lab 08/19/13 0904 08/20/13 0632  AST 31 26  ALT 8 5  ALKPHOS 72 67  BILITOT 0.4 0.4  PROT 6.9 6.4  ALBUMIN 2.7* 2.3*    Recent Labs Lab 08/19/13 0904  LIPASE 36   No results found for this basename: AMMONIA,  in the last 168 hours CBC:  Recent Labs Lab 08/19/13 0904 08/20/13 0632   WBC 5.8 5.4  NEUTROABS 4.2  --   HGB 9.8* 9.2*  HCT 29.0* 27.0*  MCV 81.7 81.3  PLT 272 261   Cardiac Enzymes: No results found for this basename: CKTOTAL, CKMB, CKMBINDEX, TROPONINI,  in the last 168 hours BNP: BNP (last 3 results) No results found for this basename: PROBNP,  in the last 8760 hours CBG: No results found for this basename: GLUCAP,  in the last 168 hours  Time coordinating discharge: Over 30 minutes

## 2013-08-23 NOTE — Progress Notes (Signed)
Checked on patient for routine scheduled BD treatment. Auscultation reveals diffuse wheezing throughout. She has received morphine and is now more compliant with BD treatments. She awakens easily and follows commands appropriately, but remains sleepy. Spontaneous respirations 20 per minute with a much more relaxed breathing pattern than earlier this shift. RT will continue to follow.

## 2013-08-24 LAB — BODY FLUID CULTURE: Culture: NO GROWTH

## 2013-09-30 ENCOUNTER — Encounter: Payer: Medicare Other | Admitting: Cardiovascular Disease

## 2013-10-14 DEATH — deceased

## 2013-10-26 ENCOUNTER — Encounter: Payer: Self-pay | Admitting: *Deleted

## 2013-11-10 ENCOUNTER — Telehealth: Payer: Self-pay

## 2013-11-10 NOTE — Telephone Encounter (Signed)
Patient past away @ [facility] per Obituary °

## 2014-12-28 ENCOUNTER — Encounter: Payer: Self-pay | Admitting: Cardiovascular Disease
